# Patient Record
Sex: Male | Born: 1935 | Race: White | Hispanic: No | Marital: Single
Health system: Midwestern US, Community
[De-identification: ages and names within clinical notes are randomized; demographics above are authoritative.]

## PROBLEM LIST (undated history)

## (undated) DIAGNOSIS — R569 Unspecified convulsions: Secondary | ICD-10-CM

## (undated) DIAGNOSIS — H5440 Blindness, one eye, unspecified eye: Secondary | ICD-10-CM

## (undated) DIAGNOSIS — R339 Retention of urine, unspecified: Secondary | ICD-10-CM

## (undated) HISTORY — PX: EYE SURGERY: SHX253

---

## 2007-05-15 ENCOUNTER — Emergency Department: Payer: Self-pay | Admitting: Emergency Medicine

## 2007-05-27 ENCOUNTER — Emergency Department: Payer: Self-pay | Admitting: Urology

## 2007-06-05 ENCOUNTER — Emergency Department: Payer: Self-pay | Admitting: Urology

## 2007-06-26 ENCOUNTER — Other Ambulatory Visit: Payer: Self-pay

## 2007-06-26 ENCOUNTER — Ambulatory Visit: Payer: Self-pay | Admitting: Urology

## 2007-07-05 ENCOUNTER — Emergency Department: Payer: Self-pay | Admitting: Emergency Medicine

## 2007-07-09 ENCOUNTER — Emergency Department: Payer: Self-pay | Admitting: Emergency Medicine

## 2007-07-10 ENCOUNTER — Ambulatory Visit: Payer: Self-pay | Admitting: Urology

## 2007-07-23 ENCOUNTER — Other Ambulatory Visit: Payer: Self-pay

## 2007-07-23 ENCOUNTER — Emergency Department: Payer: Self-pay | Admitting: Emergency Medicine

## 2011-11-30 ENCOUNTER — Ambulatory Visit: Payer: Self-pay | Admitting: Ophthalmology

## 2011-11-30 DIAGNOSIS — I499 Cardiac arrhythmia, unspecified: Secondary | ICD-10-CM

## 2011-12-06 ENCOUNTER — Ambulatory Visit: Payer: Self-pay | Admitting: Ophthalmology

## 2012-05-01 ENCOUNTER — Ambulatory Visit: Payer: Self-pay | Admitting: Ophthalmology

## 2014-08-04 NOTE — Op Note (Signed)
PATIENT NAME:  Jerry Brown, Jerry Brown MR#:  [id number] DATE OF BIRTH:  03/03/1935  DATE OF PROCEDURE:  12/06/2011   PROCEDURES PERFORMED:  1. Pars plana vitrectomy of the right eye.  2. Gas exchange of the right eye.  3. Endolaser of the right eye.  4. Retinal detachment repair right eye.   PREOPERATIVE DIAGNOSIS: Rhegmatogenous retinal detachment macula off.   POSTOPERATIVE DIAGNOSIS: Rhegmatogenous retinal detachment macula off.   ESTIMATED BLOOD LOSS: Less than 1 mL.  PRIMARY SURGEON: Ignacia FellingMatthew F. Nikky Duba, MD  ANESTHESIA: Retrobulbar block of the right eye with monitored anesthesia care.   COMPLICATIONS: None.   INDICATION FOR PROCEDURE: This is a patient who presented to my office with a macula off retinal detachment. Patient noted that he had lost central vision over two weeks prior. Risks, benefits, and alternatives of the above procedure were discussed and the patient wished to proceed.   DETAILS OF PROCEDURE: After informed consent was obtained, the patient was brought into the operative suite at [facility]. Patient was placed in supine position and was given a small dose of Alfenta and a retrobulbar block was performed on the right eye by the primary surgeon without complications. The right eye was prepped and draped in sterile manner. After lid speculum was inserted, a 25-gauge trocar was placed inferotemporally through displaced conjunctiva in an oblique fashion. The infusion cannula was turned on and inserted through the trocar and secured into position with Steri-Strips. Two more trocars were placed in a similar fashion superotemporally and superonasally. The vitreous cutter and light pipe were introduced in the eye and a core vitrectomy was performed. A peripheral vitrectomy was performed for 360 degrees with special care to go to the ora serrata without encompassing any retina. A single retinal break was identified at approximately 8:30. Endo cautery was introduced  and this tear was marked. No further tears could be identified for 360 degrees on examination. Perfluoron was injected into the posterior pole to flatten the retina up to the level of the retinal tear. The soft tip extrusion cannula was utilized to do an air fluid exchange through the retinal tear and the retina completely flattened. Perfluoron was injected in order to bring up to almost the posterior chamber well. Endolaser was introduced and four rows of laser were placed around the retinal tear. Endolaser was carried for 360 degrees from the equator to the ora serrata in order to create a new ora. Once this was completed, the Perfluoron was removed and special care was taken to remove any remnant fluid above it in order to prevent any further retinal detachment. Upon complete removal of the Perfluoron the retina was noted to be completely flat for 360 degrees. Endolaser was reintroduced in order to ensure completion of prior described laser. 22% SF6 was used as an Nurse, children'sair gas exchange. All the trocars were removed. Two of the trocar sites were closed using plain 6-0 gut. The eye was pressurized with the SF6 to a pressure of 15 mmHg. 5 mg of dexamethasone was given into the inferior fornix. The lid speculum was removed and the eye was cleaned. TobraDex was placed in the eye and a patch and shield were placed over the eye. Patient was taken to postanesthesia care with instructions to remain left side down.   ____________________________ Ignacia FellingMatthew F. Champ MungoAppenzeller, MD mfa:cms D: 12/06/2011 10:24:39 ET T: 12/06/2011 10:46:13 ET JOB#: [id number]  cc: Ignacia FellingMatthew F. Champ MungoAppenzeller, MD, <Dictator>  Cline CoolsMATTHEW F Shervon Kerwin MD ELECTRONICALLY SIGNED 12/06/2011 12:36

## 2014-08-07 NOTE — Op Note (Signed)
PATIENT NAME:  Jerry Brown, Jerry Brown MR#:  [id number] DATE OF BIRTH:  09-Oct-1934  DATE OF PROCEDURE:  05/01/2012  PREOPERATIVE DIAGNOSIS: Rhegmatogenous retinal detachment of the right eye.   POSTOPERATIVE DIAGNOSES:  1. Complex retinal detachment.  2. Proliferative vitreoretinopathy.  PROCEDURES PERFORMED: 1. Pars plana vitrectomy of the right eye.  2. Endolaser of the right eye.  3. Oil injection of the right eye.  4. Membrane peel of the right eye.   PRIMARY SURGEON: Aron Baba, MD  ANESTHESIA: Endotracheal anesthesia with monitored anesthesia care and a supplemental retrobulbar block of the right eye.   COMPLICATIONS: None.   INDICATIONS FOR PROCEDURE: This is a patient who had undergone original rhegmatogenous retinal detachment repair back in September of 2013. The patient presented to my office in November of 2013 at 2 month postop with improvement in vision from light perception to 20/100 and a completely attached retina. The patient then presented to my office two days ago noting that 3 to 4 days after his last visit he lost vision in the eye and did not contact anyone regarding this change, as opposed to instructions. Examination revealed a rhegmatogenous retinal detachment of the right eye. Risks, benefits and alternatives of the above procedure were discussed and the patient wished to proceed.   DETAILS OF PROCEDURE: After informed consent was obtained, the patient was brought into the operative suite, at [facility]. The patient was placed in supine position and was induced by the anesthesia team without complication. A retrobulbar block was performed, on the right eye, by the primary surgeon without complication. The right eye was prepped and draped in sterile manner. After lid speculum was inserted, a 23-gauge trocar was placed inferotemporally through displaced conjunctiva, in an oblique fashion, 3 mm beyond the limbus. The infusion cannula was turned on  and inserted through the trocar and secured into position with Steri-Strips. Two more trocars were placed, in a similar fashion, superotemporally and superonasally. Vitreous cutter and light pipe were introduced in the eye and the retina was investigated. At the original tear site, at approximately 3 o'clock, proliferative vitreoretinopathy was noted with an associated retinal tear. No further tears could be identified for 360 degrees along the posterior edge of the prior laser. This area of proliferative vitreoretinopathy was incorporated with the new tear and was amputated using the vitreous cutter. PFO was injected onto the posterior and the retina completely flattened with extrusion of fluid out through the new tear. Once the retina was completely flattened, endolaser was introduced in the eye and panretinal photocoagulation was performed from the arcades out to the ora serrata for 360 degrees. Special care of four rows of confluent laser were placed at the area of proliferative vitreoretinopathy amputation. An air-fluid exchange was then performed down to the level of the retinal opening and time was allowed for dehydration of the vitreous base. This fluid was removed and the retina remained flat. The rest of the Perfluoron was then carefully removed in toto. Endolaser was reintroduced and areas of laser gaps were filled around the retinal opening. Once this was completed, 5000 centistoke silicone oil was injected into the posterior up to the level of the IOL. Just prior to this an inferior mechanical retinotomy was created. The silicone oil was brought up to the IOL and the trocars were removed. Pressure in the eye was confirmed to be approximately 10 mmHg. The wounds were noted to be watertight and 8 mg of dexamethasone was given into the inferior fornix.  The lid speculum was removed and the eye was cleaned. Cosopt and TobraDex was placed on the eye and a patch and shield were placed over the eye. The patient  was taken to postanesthesia care with instructions to remain right side down.  ____________________________ Jerry FellingMatthew F. Nikyah Lackman, MD mfa:sb D: 05/01/2012 11:11:00 ET T: 05/01/2012 11:53:53 ET JOB#: [id number]  cc: Jerry FellingMatthew F. Champ MungoAppenzeller, MD, <Dictator> Cline CoolsMATTHEW F Zyler Hyson MD ELECTRONICALLY SIGNED 05/08/2012 9:59

## 2015-01-07 ENCOUNTER — Encounter: Payer: Self-pay | Admitting: Emergency Medicine

## 2015-01-07 ENCOUNTER — Emergency Department
Admission: EM | Admit: 2015-01-07 | Discharge: 2015-01-07 | Disposition: A | Discharge status: Home / Self Care | Payer: Medicare Other | Attending: Emergency Medicine | Admitting: Emergency Medicine

## 2015-01-07 ENCOUNTER — Emergency Department: Payer: Medicare Other

## 2015-01-07 DIAGNOSIS — Y9389 Activity, other specified: Secondary | ICD-10-CM | POA: Diagnosis not present

## 2015-01-07 DIAGNOSIS — Y998 Other external cause status: Secondary | ICD-10-CM | POA: Diagnosis not present

## 2015-01-07 DIAGNOSIS — Y92410 Unspecified street and highway as the place of occurrence of the external cause: Secondary | ICD-10-CM | POA: Insufficient documentation

## 2015-01-07 DIAGNOSIS — Z87891 Personal history of nicotine dependence: Secondary | ICD-10-CM | POA: Diagnosis not present

## 2015-01-07 DIAGNOSIS — S4991XA Unspecified injury of right shoulder and upper arm, initial encounter: Secondary | ICD-10-CM | POA: Diagnosis present

## 2015-01-07 DIAGNOSIS — S43101A Unspecified dislocation of right acromioclavicular joint, initial encounter: Secondary | ICD-10-CM | POA: Diagnosis not present

## 2015-01-07 HISTORY — DX: Blindness, one eye, unspecified eye: H54.40

## 2015-01-07 MED ORDER — OXYCODONE-ACETAMINOPHEN 5-325 MG PO TABS
1.0000 | ORAL_TABLET | Freq: Once | ORAL | Status: AC
  Administered 2015-01-07: 1 via ORAL
  Filled 2015-01-07: qty 1

## 2015-01-07 MED ORDER — OXYCODONE-ACETAMINOPHEN 5-325 MG PO TABS: 1.0000 | ORAL_TABLET | Freq: Once | ORAL | Status: AC
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
## 2015-01-07 NOTE — Discharge Instructions (Signed)
Acromioclavicular Injuries °The AC (acromioclavicular) joint is the joint in the shoulder where the collarbone (clavicle) meets the shoulder blade (scapula). The part of the shoulder blade connected to the collarbone is called the acromion. Common problems with and treatments for the AC joint are detailed below. °ARTHRITIS °Arthritis occurs when the joint has been injured and the smooth padding between the joints (cartilage) is lost. This is the wear and tear seen in most joints of the body if they have been overused. This causes the joint to produce pain and swelling which is worse with activity.  °AC JOINT SEPARATION °AC joint separation means that the ligaments connecting the acromion of the shoulder blade and collarbone have been damaged, and the two bones no longer line up. AC separations can be anywhere from mild to severe, and are "graded" depending upon which ligaments are torn and how badly they are torn. °· Grade I Injury: the least damage is done, and the AC joint still lines up. °· Grade II Injury: damage to the ligaments which reinforce the AC joint. In a Grade II injury, these ligaments are stretched but not entirely torn. When stressed, the AC joint becomes painful and unstable. °· Grade III Injury: AC and secondary ligaments are completely torn, and the collarbone is no longer attached to the shoulder blade. This results in deformity; a prominence of the end of the clavicle. °AC JOINT FRACTURE °AC joint fracture means that there has been a break in the bones of the AC joint, usually the end of the clavicle. °TREATMENT °TREATMENT OF AC ARTHRITIS °· There is currently no way to replace the cartilage damaged by arthritis. The best way to improve the condition is to decrease the activities which aggravate the problem. Application of ice to the joint helps decrease pain and soreness (inflammation). The use of non-steroidal anti-inflammatory medication is helpful. °· If less conservative measures do not  work, then cortisone shots (injections) may be used. These are anti-inflammatories; they decrease the soreness in the joint and swelling. °· If non-surgical measures fail, surgery may be recommended. The procedure is generally removal of a portion of the end of the clavicle. This is the part of the collarbone closest to your acromion which is stabilized with ligaments to the acromion of the shoulder blade. This surgery may be performed using a tube-like instrument with a light (arthroscope) for looking into a joint. It may also be performed as an open surgery through a small incision by the surgeon. Most patients will have good range of motion within 6 weeks and may return to all activity including sports by 8-12 weeks, barring complications. °TREATMENT OF AN AC SEPARATION °· The initial treatment is to decrease pain. This is best accomplished by immobilizing the arm in a sling and placing an ice pack to the shoulder for 20 to 30 minutes every 2 hours as needed. As the pain starts to subside, it is important to begin moving the fingers, wrist, elbow and eventually the shoulder in order to prevent a stiff or "frozen" shoulder. Instruction on when and how much to move the shoulder will be provided by your caregiver. The length of time needed to regain full motion and function depends on the amount or grade of the injury. Recovery from a Grade I AC separation usually takes 10 to 14 days, whereas a Grade III may take 6 to 8 weeks. °· Grade I and II separations usually do not require surgery. Even Grade III injuries usually allow return to full   activity with few restrictions. Treatment is also based on the activity demands of the injured shoulder. For example, a high level quarterback with an injured throwing arm will receive more aggressive treatment than someone with a desk job who rarely uses his/her arm for strenuous activities. In some cases, a painful lump may persist which could require a later surgery. Surgery  can be very successful, but the benefits must be weighed against the potential risks. °TREATMENT OF AN AC JOINT FRACTURE °Fracture treatment depends on the type of fracture. Sometimes a splint or sling may be all that is required. Other times surgery may be required for repair. This is more frequently the case when the ligaments supporting the clavicle are completely torn. Your caregiver will help you with these decisions and together you can decide what will be the best treatment. °HOME CARE INSTRUCTIONS  °· Apply ice to the injury for 15-20 minutes each hour while awake for 2 days. Put the ice in a plastic bag and place a towel between the bag of ice and skin. °· If a sling has been applied, wear it constantly for as long as directed by your caregiver, even at night. The sling or splint can be removed for bathing or showering or as directed. Be sure to keep the shoulder in the same place as when the sling is on. Do not lift the arm. °· If a figure-of-eight splint has been applied it should be tightened gently by another person every day. Tighten it enough to keep the shoulders held back. Allow enough room to place the index finger between the body and strap. Loosen the splint immediately if there is numbness or tingling in the hands. °· Take over-the-counter or prescription medicines for pain, discomfort or fever as directed by your caregiver. °· If you or your child has received a follow up appointment, it is very important to keep that appointment in order to avoid long term complications, chronic pain or disability. °SEEK MEDICAL CARE IF:  °· The pain is not relieved with medications. °· There is increased swelling or discoloration that continues to get worse rather than better. °· You or your child has been unable to follow up as instructed. °· There is progressive numbness and tingling in the arm, forearm or hand. °SEEK IMMEDIATE MEDICAL CARE IF:  °· The arm is numb, cold or pale. °· There is increasing pain  in the hand, forearm or fingers. °MAKE SURE YOU:  °· Understand these instructions. °· Will watch your condition. °· Will get help right away if you are not doing well or get worse. °Document Released: 01/11/2005 Document Revised: 06/26/2011 Document Reviewed: 07/06/2008 °[employer]® Patient Information ©2015 [employer]. This information is not intended to replace advice given to you by your health care provider. Make sure you discuss any questions you have with your health care provider. ° °
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
## 2015-02-13 ENCOUNTER — Inpatient Hospital Stay (HOSPITAL_COMMUNITY)
Admission: EM | Admit: 2015-02-13 | Discharge: 2015-02-19 | DRG: 027 | Disposition: A | Discharge status: Home Health | Payer: Medicare Other | Source: Other Acute Inpatient Hospital | Attending: Neurosurgery | Admitting: Neurosurgery

## 2015-02-13 ENCOUNTER — Encounter
Admission: EM | Disposition: A | Discharge status: Other Acute Inpatient Hospital | Payer: Self-pay | Source: Home / Self Care | Attending: Emergency Medicine

## 2015-02-13 ENCOUNTER — Inpatient Hospital Stay (HOSPITAL_COMMUNITY): Payer: Medicare Other | Admitting: Anesthesiology

## 2015-02-13 ENCOUNTER — Emergency Department
Admission: EM | Admit: 2015-02-13 | Discharge: 2015-02-13 | Disposition: A | Discharge status: Other Acute Inpatient Hospital | Payer: Medicare Other | Attending: Emergency Medicine | Admitting: Emergency Medicine

## 2015-02-13 ENCOUNTER — Emergency Department: Payer: Medicare Other

## 2015-02-13 DIAGNOSIS — S065X9A Traumatic subdural hemorrhage with loss of consciousness of unspecified duration, initial encounter: Secondary | ICD-10-CM

## 2015-02-13 DIAGNOSIS — Z9181 History of falling: Secondary | ICD-10-CM | POA: Diagnosis not present

## 2015-02-13 DIAGNOSIS — R51 Headache: Secondary | ICD-10-CM | POA: Insufficient documentation

## 2015-02-13 DIAGNOSIS — S065XAA Traumatic subdural hemorrhage with loss of consciousness status unknown, initial encounter: Secondary | ICD-10-CM

## 2015-02-13 DIAGNOSIS — W1830XA Fall on same level, unspecified, initial encounter: Secondary | ICD-10-CM | POA: Diagnosis present

## 2015-02-13 DIAGNOSIS — S065X0A Traumatic subdural hemorrhage without loss of consciousness, initial encounter: Principal | ICD-10-CM | POA: Diagnosis present

## 2015-02-13 DIAGNOSIS — Y92096 Garden or yard of other non-institutional residence as the place of occurrence of the external cause: Secondary | ICD-10-CM | POA: Insufficient documentation

## 2015-02-13 DIAGNOSIS — W19XXXA Unspecified fall, initial encounter: Secondary | ICD-10-CM | POA: Insufficient documentation

## 2015-02-13 DIAGNOSIS — R079 Chest pain, unspecified: Secondary | ICD-10-CM | POA: Insufficient documentation

## 2015-02-13 DIAGNOSIS — G8929 Other chronic pain: Secondary | ICD-10-CM | POA: Diagnosis present

## 2015-02-13 DIAGNOSIS — N401 Enlarged prostate with lower urinary tract symptoms: Secondary | ICD-10-CM | POA: Diagnosis present

## 2015-02-13 DIAGNOSIS — Z87898 Personal history of other specified conditions: Secondary | ICD-10-CM | POA: Insufficient documentation

## 2015-02-13 DIAGNOSIS — H5440 Blindness, one eye, unspecified eye: Secondary | ICD-10-CM | POA: Diagnosis present

## 2015-02-13 DIAGNOSIS — Z87891 Personal history of nicotine dependence: Secondary | ICD-10-CM | POA: Diagnosis not present

## 2015-02-13 DIAGNOSIS — Z79899 Other long term (current) drug therapy: Secondary | ICD-10-CM | POA: Diagnosis not present

## 2015-02-13 DIAGNOSIS — M549 Dorsalgia, unspecified: Secondary | ICD-10-CM | POA: Diagnosis present

## 2015-02-13 DIAGNOSIS — H5441 Blindness, right eye, normal vision left eye: Secondary | ICD-10-CM | POA: Insufficient documentation

## 2015-02-13 DIAGNOSIS — R569 Unspecified convulsions: Secondary | ICD-10-CM | POA: Insufficient documentation

## 2015-02-13 DIAGNOSIS — R339 Retention of urine, unspecified: Secondary | ICD-10-CM | POA: Diagnosis present

## 2015-02-13 DIAGNOSIS — Y999 Unspecified external cause status: Secondary | ICD-10-CM | POA: Insufficient documentation

## 2015-02-13 DIAGNOSIS — I517 Cardiomegaly: Secondary | ICD-10-CM | POA: Insufficient documentation

## 2015-02-13 DIAGNOSIS — Y939 Activity, unspecified: Secondary | ICD-10-CM | POA: Insufficient documentation

## 2015-02-13 DIAGNOSIS — R310 Gross hematuria: Secondary | ICD-10-CM | POA: Diagnosis present

## 2015-02-13 HISTORY — DX: Unspecified convulsions: R56.9

## 2015-02-13 HISTORY — PX: CRANIOTOMY: SHX93

## 2015-02-13 LAB — ABO/RH: ABO/RH(D): A POS

## 2015-02-13 LAB — CBC WITH DIFFERENTIAL/PLATELET
BASOS ABS: 0 10*3/uL (ref 0–0.1)
BASOS PCT: 1 %
EOS ABS: 0.1 10*3/uL (ref 0–0.7)
EOS PCT: 3 %
HCT: 43.2 % (ref 40.0–52.0)
Hemoglobin: 14.8 g/dL (ref 13.0–18.0)
Lymphocytes Relative: 15 %
Lymphs Abs: 0.8 10*3/uL — ABNORMAL LOW (ref 1.0–3.6)
MCH: 32.4 pg (ref 26.0–34.0)
MCHC: 34.3 g/dL (ref 32.0–36.0)
MCV: 94.5 fL (ref 80.0–100.0)
MONO ABS: 0.3 10*3/uL (ref 0.2–1.0)
Monocytes Relative: 6 %
Neutro Abs: 4 10*3/uL (ref 1.4–6.5)
Neutrophils Relative %: 75 %
PLATELETS: 136 10*3/uL — AB (ref 150–440)
RBC: 4.57 MIL/uL (ref 4.40–5.90)
RDW: 13.5 % (ref 11.5–14.5)
WBC: 5.3 10*3/uL (ref 3.8–10.6)

## 2015-02-13 LAB — MRSA PCR SCREENING: MRSA BY PCR: NEGATIVE

## 2015-02-13 LAB — COMPREHENSIVE METABOLIC PANEL
ALT: 22 U/L (ref 17–63)
AST: 24 U/L (ref 15–41)
Albumin: 4 g/dL (ref 3.5–5.0)
Alkaline Phosphatase: 88 U/L (ref 38–126)
Anion gap: 4 — ABNORMAL LOW (ref 5–15)
BUN: 16 mg/dL (ref 6–20)
CHLORIDE: 110 mmol/L (ref 101–111)
CO2: 27 mmol/L (ref 22–32)
CREATININE: 1.05 mg/dL (ref 0.61–1.24)
Calcium: 8.9 mg/dL (ref 8.9–10.3)
GFR calc Af Amer: >60 mL/min (ref >60)
Glucose, Bld: 107 mg/dL — ABNORMAL HIGH (ref 65–99)
POTASSIUM: 4.1 mmol/L (ref 3.5–5.1)
SODIUM: 141 mmol/L (ref 135–145)
Total Bilirubin: 1.5 mg/dL — ABNORMAL HIGH (ref 0.3–1.2)
Total Protein: 6.8 g/dL (ref 6.5–8.1)

## 2015-02-13 LAB — CK: CK TOTAL: 250 U/L (ref 49–397)

## 2015-02-13 LAB — PREPARE RBC (CROSSMATCH)

## 2015-02-13 LAB — PROTIME-INR
INR: 1.04
Prothrombin Time: 13.8 seconds (ref 11.4–15.0)

## 2015-02-13 LAB — TROPONIN I

## 2015-02-13 LAB — APTT: APTT: 31 s (ref 24–36)

## 2015-02-13 SURGERY — CRANIOTOMY HEMATOMA EVACUATION SUBDURAL
Anesthesia: General | Site: Head | Laterality: Bilateral

## 2015-02-13 MED ORDER — SUGAMMADEX SODIUM 200 MG/2ML IV SOLN
INTRAVENOUS | Status: DC | PRN
  Administered 2015-02-13: 200 mg via INTRAVENOUS

## 2015-02-13 MED ORDER — LABETALOL HCL 5 MG/ML IV SOLN
10.0000 mg | INTRAVENOUS | Status: DC | PRN
Start: 2015-02-13 — End: 2015-02-19

## 2015-02-13 MED ORDER — FOLIC ACID 1 MG PO TABS
1.0000 mg | ORAL_TABLET | Freq: Once | ORAL | Status: AC
  Administered 2015-02-13: 1 mg via ORAL
  Filled 2015-02-13: qty 1

## 2015-02-13 MED ORDER — FENTANYL CITRATE (PF) 100 MCG/2ML IJ SOLN
INTRAMUSCULAR | Status: DC | PRN
  Administered 2015-02-13 (×3): 50 ug via INTRAVENOUS
  Administered 2015-02-13: 100 ug via INTRAVENOUS

## 2015-02-13 MED ORDER — ACETAMINOPHEN 325 MG PO TABS
650.0000 mg | ORAL_TABLET | ORAL | Status: DC | PRN
  Administered 2015-02-15 – 2015-02-19 (×3): 650 mg via ORAL
  Filled 2015-02-13 (×3): qty 2

## 2015-02-13 MED ORDER — SODIUM CHLORIDE 0.9 % IV SOLN: Freq: Once | INTRAVENOUS | Status: DC

## 2015-02-13 MED ORDER — PROPOFOL 10 MG/ML IV BOLUS
INTRAVENOUS | Status: DC | PRN
  Administered 2015-02-13: 120 mg via INTRAVENOUS

## 2015-02-13 MED ORDER — VITAMIN B-1 100 MG PO TABS
100.0000 mg | ORAL_TABLET | Freq: Once | ORAL | Status: AC
  Administered 2015-02-13: 100 mg via ORAL
  Filled 2015-02-13: qty 1

## 2015-02-13 MED ORDER — LIDOCAINE HCL (CARDIAC) 20 MG/ML IV SOLN
INTRAVENOUS | Status: DC | PRN
  Administered 2015-02-13: 80 mg via INTRAVENOUS

## 2015-02-13 MED ORDER — POTASSIUM CHLORIDE IN NACL 20-0.9 MEQ/L-% IV SOLN
INTRAVENOUS | Status: DC
  Administered 2015-02-13: 1000 mL via INTRAVENOUS
  Administered 2015-02-14: 75 mL/h via INTRAVENOUS
  Administered 2015-02-15 – 2015-02-18 (×6): via INTRAVENOUS
  Filled 2015-02-13 (×13): qty 1000

## 2015-02-13 MED ORDER — MORPHINE SULFATE (PF) 2 MG/ML IV SOLN
1.0000 mg | INTRAVENOUS | Status: DC | PRN
  Administered 2015-02-13: 2 mg via INTRAVENOUS
  Administered 2015-02-16 – 2015-02-17 (×2): 1 mg via INTRAVENOUS
  Administered 2015-02-17: 2 mg via INTRAVENOUS
  Filled 2015-02-13 (×4): qty 1

## 2015-02-13 MED ORDER — ACETAMINOPHEN 650 MG RE SUPP: 650.0000 mg | RECTAL | Status: DC | PRN

## 2015-02-13 MED ORDER — SODIUM CHLORIDE 0.9 % IV SOLN
INTRAVENOUS | Status: DC | PRN
  Administered 2015-02-13: 17:00:00 via INTRAVENOUS

## 2015-02-13 MED ORDER — THROMBIN 20000 UNITS EX SOLR
CUTANEOUS | Status: DC | PRN
  Administered 2015-02-13: 20 mL via TOPICAL

## 2015-02-13 MED ORDER — HYDROCODONE-ACETAMINOPHEN 5-325 MG PO TABS
1.0000 | ORAL_TABLET | ORAL | Status: DC | PRN
  Administered 2015-02-13 – 2015-02-17 (×7): 1 via ORAL
  Filled 2015-02-13 (×7): qty 1

## 2015-02-13 MED ORDER — BUPIVACAINE HCL (PF) 0.25 % IJ SOLN
INTRAMUSCULAR | Status: DC | PRN
  Administered 2015-02-13: 8.5 mL

## 2015-02-13 MED ORDER — 0.9 % SODIUM CHLORIDE (POUR BTL) OPTIME
TOPICAL | Status: DC | PRN
  Administered 2015-02-13 (×5): 1000 mL

## 2015-02-13 MED ORDER — SODIUM CHLORIDE 0.9 % IV SOLN
500.0000 mg | Freq: Two times a day (BID) | INTRAVENOUS | Status: DC
  Administered 2015-02-13 – 2015-02-17 (×8): 500 mg via INTRAVENOUS
  Filled 2015-02-13 (×9): qty 5

## 2015-02-13 MED ORDER — PANTOPRAZOLE SODIUM 40 MG IV SOLR: 40.0000 mg | Freq: Every day | INTRAVENOUS | Status: DC

## 2015-02-13 MED ORDER — SODIUM CHLORIDE 0.9 % IV SOLN
INTRAVENOUS | Status: DC | PRN
  Administered 2015-02-13 (×2): via INTRAVENOUS

## 2015-02-13 MED ORDER — ADULT MULTIVITAMIN W/MINERALS CH
1.0000 | ORAL_TABLET | Freq: Once | ORAL | Status: AC
  Administered 2015-02-13: 1 via ORAL
  Filled 2015-02-13: qty 1

## 2015-02-13 MED ORDER — EPHEDRINE SULFATE 50 MG/ML IJ SOLN
INTRAMUSCULAR | Status: DC | PRN
  Administered 2015-02-13 (×3): 10 mg via INTRAVENOUS

## 2015-02-13 MED ORDER — LIDOCAINE-EPINEPHRINE 1 %-1:100000 IJ SOLN
INTRAMUSCULAR | Status: DC | PRN
  Administered 2015-02-13: 8.5 mL via INTRADERMAL

## 2015-02-13 MED ORDER — LABETALOL HCL 5 MG/ML IV SOLN
INTRAVENOUS | Status: DC | PRN
  Administered 2015-02-13: 10 mg via INTRAVENOUS

## 2015-02-13 MED ORDER — DEXTROSE 5 % IV SOLN
10.0000 mg | INTRAVENOUS | Status: DC | PRN
  Administered 2015-02-13: 50 ug/min via INTRAVENOUS

## 2015-02-13 MED ORDER — ONDANSETRON HCL 4 MG PO TABS: 4.0000 mg | ORAL_TABLET | ORAL | Status: DC | PRN

## 2015-02-13 MED ORDER — LABETALOL HCL 5 MG/ML IV SOLN
INTRAVENOUS | Status: AC
  Filled 2015-02-13: qty 8

## 2015-02-13 MED ORDER — PHENYLEPHRINE HCL 10 MG/ML IJ SOLN
INTRAMUSCULAR | Status: DC | PRN
  Administered 2015-02-13 (×5): 80 ug via INTRAVENOUS

## 2015-02-13 MED ORDER — FENTANYL CITRATE (PF) 250 MCG/5ML IJ SOLN
INTRAMUSCULAR | Status: AC
  Filled 2015-02-13: qty 5

## 2015-02-13 MED ORDER — ONDANSETRON HCL 4 MG/2ML IJ SOLN: 4.0000 mg | INTRAMUSCULAR | Status: DC | PRN

## 2015-02-13 MED ORDER — ROCURONIUM BROMIDE 100 MG/10ML IV SOLN
INTRAVENOUS | Status: DC | PRN
  Administered 2015-02-13: 50 mg via INTRAVENOUS

## 2015-02-13 MED ORDER — SUGAMMADEX SODIUM 200 MG/2ML IV SOLN
INTRAVENOUS | Status: AC
  Filled 2015-02-13: qty 2

## 2015-02-13 MED ORDER — CEFAZOLIN SODIUM-DEXTROSE 2-3 GM-% IV SOLR
INTRAVENOUS | Status: DC | PRN
  Administered 2015-02-13: 2 g via INTRAVENOUS

## 2015-02-13 MED ORDER — GLYCOPYRROLATE 0.2 MG/ML IJ SOLN
INTRAMUSCULAR | Status: DC | PRN
  Administered 2015-02-13 (×2): 0.2 mg via INTRAVENOUS

## 2015-02-13 MED ORDER — DOCUSATE SODIUM 100 MG PO CAPS
100.0000 mg | ORAL_CAPSULE | Freq: Two times a day (BID) | ORAL | Status: DC
  Administered 2015-02-13 – 2015-02-19 (×12): 100 mg via ORAL
  Filled 2015-02-13 (×12): qty 1

## 2015-02-13 SURGICAL SUPPLY — 78 items
BANDAGE GAUZE 4  KLING STR (GAUZE/BANDAGES/DRESSINGS) IMPLANT
BENZOIN TINCTURE PRP APPL 2/3 (GAUZE/BANDAGES/DRESSINGS) IMPLANT
BIT DRILL WIRE PASS 1.3MM (BIT) IMPLANT
BNDG GAUZE ELAST 4 BULKY (GAUZE/BANDAGES/DRESSINGS) IMPLANT
BRUSH SCRUB EZ 1% IODOPHOR (MISCELLANEOUS) ×3 IMPLANT
BRUSH SCRUB EZ PLAIN DRY (MISCELLANEOUS) ×3 IMPLANT
BUR ACORN 6.0 PRECISION (BURR) ×2 IMPLANT
BUR ACORN 6.0MM PRECISION (BURR) ×1
BUR SPIRAL ROUTER 2.3 (BUR) IMPLANT
BUR SPIRAL ROUTER 2.3MM (BUR)
CANISTER SUCT 3000ML PPV (MISCELLANEOUS) ×3 IMPLANT
CATH ROBINSON RED A/P 12FR (CATHETERS) IMPLANT
CLIP TI MEDIUM 6 (CLIP) IMPLANT
DECANTER SPIKE VIAL GLASS SM (MISCELLANEOUS) ×3 IMPLANT
DRAIN PENROSE 1/2X12 LTX STRL (WOUND CARE) IMPLANT
DRAIN SNY WOU 7FLT (WOUND CARE) IMPLANT
DRAPE NEUROLOGICAL W/INCISE (DRAPES) ×3 IMPLANT
DRAPE SURG IRRIG POUCH 19X23 (DRAPES) IMPLANT
DRAPE WARM FLUID 44X44 (DRAPE) ×3 IMPLANT
DRILL WIRE PASS 1.3MM (BIT)
DRSG OPSITE POSTOP 4X6 (GAUZE/BANDAGES/DRESSINGS) ×6 IMPLANT
DRSG PAD ABDOMINAL 8X10 ST (GAUZE/BANDAGES/DRESSINGS) IMPLANT
DURAPREP 6ML APPLICATOR 50/CS (WOUND CARE) ×3 IMPLANT
ELECT CAUTERY BLADE 6.4 (BLADE) ×3 IMPLANT
ELECT REM PT RETURN 9FT ADLT (ELECTROSURGICAL) ×3
ELECTRODE REM PT RTRN 9FT ADLT (ELECTROSURGICAL) ×1 IMPLANT
EVACUATOR SILICONE 100CC (DRAIN) IMPLANT
GAUZE SPONGE 4X4 12PLY STRL (GAUZE/BANDAGES/DRESSINGS) IMPLANT
GAUZE SPONGE 4X4 16PLY XRAY LF (GAUZE/BANDAGES/DRESSINGS) IMPLANT
GLOVE BIO SURGEON STRL SZ 6.5 (GLOVE) ×2 IMPLANT
GLOVE BIO SURGEON STRL SZ7 (GLOVE) ×9 IMPLANT
GLOVE BIO SURGEON STRL SZ8 (GLOVE) ×3 IMPLANT
GLOVE BIO SURGEONS STRL SZ 6.5 (GLOVE) ×1
GLOVE BIOGEL PI IND STRL 8 (GLOVE) ×1 IMPLANT
GLOVE BIOGEL PI IND STRL 8.5 (GLOVE) ×1 IMPLANT
GLOVE BIOGEL PI INDICATOR 8 (GLOVE) ×2
GLOVE BIOGEL PI INDICATOR 8.5 (GLOVE) ×2
GLOVE ECLIPSE 7.5 STRL STRAW (GLOVE) ×3 IMPLANT
GLOVE EXAM NITRILE LRG STRL (GLOVE) IMPLANT
GLOVE EXAM NITRILE MD LF STRL (GLOVE) IMPLANT
GLOVE EXAM NITRILE XL STR (GLOVE) IMPLANT
GLOVE EXAM NITRILE XS STR PU (GLOVE) IMPLANT
GOWN STRL REUS W/ TWL LRG LVL3 (GOWN DISPOSABLE) IMPLANT
GOWN STRL REUS W/ TWL XL LVL3 (GOWN DISPOSABLE) IMPLANT
GOWN STRL REUS W/TWL 2XL LVL3 (GOWN DISPOSABLE) IMPLANT
GOWN STRL REUS W/TWL LRG LVL3 (GOWN DISPOSABLE)
GOWN STRL REUS W/TWL XL LVL3 (GOWN DISPOSABLE)
HEMOSTAT SURGICEL 2X14 (HEMOSTASIS) ×3 IMPLANT
KIT BASIN OR (CUSTOM PROCEDURE TRAY) ×3 IMPLANT
KIT ROOM TURNOVER OR (KITS) ×3 IMPLANT
NEEDLE HYPO 25X1 1.5 SAFETY (NEEDLE) ×3 IMPLANT
NS IRRIG 1000ML POUR BTL (IV SOLUTION) ×3 IMPLANT
PACK CRANIOTOMY (CUSTOM PROCEDURE TRAY) ×3 IMPLANT
PAD ARMBOARD 7.5X6 YLW CONV (MISCELLANEOUS) ×3 IMPLANT
PATTIES SURGICAL .5 X.5 (GAUZE/BANDAGES/DRESSINGS) IMPLANT
PATTIES SURGICAL .5 X3 (DISPOSABLE) IMPLANT
PATTIES SURGICAL 1X1 (DISPOSABLE) IMPLANT
PIN MAYFIELD SKULL DISP (PIN) IMPLANT
PLATE 1.5  2HOLE LNG NEURO (Plate) ×6 IMPLANT
PLATE 1.5 2HOLE LNG NEURO (Plate) ×3 IMPLANT
PLATE 1.5/0.5 8HOLE SUB TEMP (Plate) ×3 IMPLANT
SCREW SELF DRILL HT 1.5/4MM (Screw) ×30 IMPLANT
SPECIMEN JAR SMALL (MISCELLANEOUS) IMPLANT
SPONGE NEURO XRAY DETECT 1X3 (DISPOSABLE) IMPLANT
SPONGE SURGIFOAM ABS GEL 12-7 (HEMOSTASIS) IMPLANT
STAPLER SKIN PROX WIDE 3.9 (STAPLE) ×3 IMPLANT
SUT ETHILON 3 0 FSL (SUTURE) ×3 IMPLANT
SUT ETHILON 3 0 PS 1 (SUTURE) IMPLANT
SUT NURALON 4 0 TR CR/8 (SUTURE) ×6 IMPLANT
SUT VIC AB 2-0 CP2 18 (SUTURE) ×6 IMPLANT
SUT VIC AB 3-0 SH 8-18 (SUTURE) IMPLANT
SYR CONTROL 10ML LL (SYRINGE) ×3 IMPLANT
TOWEL OR 17X24 6PK STRL BLUE (TOWEL DISPOSABLE) ×3 IMPLANT
TOWEL OR 17X26 10 PK STRL BLUE (TOWEL DISPOSABLE) ×3 IMPLANT
TRAP SPECIMEN MUCOUS 40CC (MISCELLANEOUS) IMPLANT
TRAY FOLEY W/METER SILVER 14FR (SET/KITS/TRAYS/PACK) IMPLANT
UNDERPAD 30X30 INCONTINENT (UNDERPADS AND DIAPERS) IMPLANT
WATER STERILE IRR 1000ML POUR (IV SOLUTION) ×3 IMPLANT

## 2015-02-13 NOTE — Brief Op Note (Signed)
02/13/2015  6:57 PM  PATIENT:  Jerry Brown  80 y.o. male  PRE-OPERATIVE DIAGNOSIS:  bilateral subdural hematomas  POST-OPERATIVE DIAGNOSIS:  bilateral subdural hematomas  PROCEDURE:  Procedure(s): BILATERAL CRANIOTOMY HEMATOMA EVACUATION SUBDURAL (Bilateral)  SURGEON:  Surgeon(s) and Role:    * Maeola HarmanJoseph Onis Markoff, MD - Primary  PHYSICIAN ASSISTANT:   ASSISTANTS: none   ANESTHESIA:   general  EBL:  Total I/O In: 1500 [I.V.:1500] Out: 100 [Blood:100]  BLOOD ADMINISTERED:none  DRAINS: (10) Jackson-Pratt drain(s) with closed bulb suction in the subdural space   LOCAL MEDICATIONS USED:  LIDOCAINE   SPECIMEN:  No Specimen  DISPOSITION OF SPECIMEN:  N/A  COUNTS:  YES  TOURNIQUET:  * No tourniquets in log *  DICTATION: Patient is 80 year old man who has developed bilateral subdural hematomas. He has had progressively worsening headache and CT shows bilateral subdural hematoma with mass effect. It was elected to take patient to surgery for bilateral craniotomies for SDH.  Procedure: Following smooth intubation, patient was placed in brow up position. Head was placed on donut head holder and bi-frontal scalp was shaved and prepped and draped in usual sterile fashion. Area of planned incision was infiltrated with lidocaine. A ilinear incision was made and carried through temporalis fascia and muscle to expose calvarium on each side of his head. Skull flaps was elevated exposing subdural hematoma. Dura was opened and subdural was evacuated. The subdural was larger and under greater pressure on the right. Both subdural cavities were irrigated with saline until significantly clearer. Subdural membranes were opened on both sides. Hemostasis was assured. The brain was considerably more relaxed after hematoma evacuation. Bilateral #10 JP drains were placed and anchored with Nylon sutures. Bone flaps was replaced with plates on the left and a titanium plate was used on the right because of  inadvertent contamination of the bone flap from the right side, the fascia and galea were closed with 2-0 vicryl sutures and the skin was re approximated with staples. Sterile occlusive dressings were placed. Patient was returned to a supine position, extubated,  and transferred to the ICU in stable and satisfactory condition. Counts were correct at the end of the case.   PLAN OF CARE: Admit to inpatient   PATIENT DISPOSITION:  PACU - hemodynamically stable.   Delay start of Pharmacological VTE agent (>24hrs) due to surgical blood loss or risk of bleeding: yes
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
## 2015-02-13 NOTE — Op Note (Signed)
02/13/2015  6:57 PM  PATIENT:  Zamarion Charles Eldredge  80 y.o. male  PRE-OPERATIVE DIAGNOSIS:  bilateral subdural hematomas  POST-OPERATIVE DIAGNOSIS:  bilateral subdural hematomas  PROCEDURE:  Procedure(s): BILATERAL CRANIOTOMY HEMATOMA EVACUATION SUBDURAL (Bilateral)  SURGEON:  Surgeon(s) and Role:    * Glendon Fiser, MD - Primary  PHYSICIAN ASSISTANT:   ASSISTANTS: none   ANESTHESIA:   general  EBL:  Total I/O In: 1500 [I.V.:1500] Out: 100 [Blood:100]  BLOOD ADMINISTERED:none  DRAINS: (10) Jackson-Pratt drain(s) with closed bulb suction in the subdural space   LOCAL MEDICATIONS USED:  LIDOCAINE   SPECIMEN:  No Specimen  DISPOSITION OF SPECIMEN:  N/A  COUNTS:  YES  TOURNIQUET:  * No tourniquets in log *  DICTATION: Patient is 80 year old man who has developed bilateral subdural hematomas. He has had progressively worsening headache and CT shows bilateral subdural hematoma with mass effect. It was elected to take patient to surgery for bilateral craniotomies for SDH.  Procedure: Following smooth intubation, patient was placed in brow up position. Head was placed on donut head holder and bi-frontal scalp was shaved and prepped and draped in usual sterile fashion. Area of planned incision was infiltrated with lidocaine. A ilinear incision was made and carried through temporalis fascia and muscle to expose calvarium on each side of his head. Skull flaps was elevated exposing subdural hematoma. Dura was opened and subdural was evacuated. The subdural was larger and under greater pressure on the right. Both subdural cavities were irrigated with saline until significantly clearer. Subdural membranes were opened on both sides. Hemostasis was assured. The brain was considerably more relaxed after hematoma evacuation. Bilateral #10 JP drains were placed and anchored with Nylon sutures. Bone flaps was replaced with plates on the left and a titanium plate was used on the right because of  inadvertent contamination of the bone flap from the right side, the fascia and galea were closed with 2-0 vicryl sutures and the skin was re approximated with staples. Sterile occlusive dressings were placed. Patient was returned to a supine position, extubated,  and transferred to the ICU in stable and satisfactory condition. Counts were correct at the end of the case.   PLAN OF CARE: Admit to inpatient   PATIENT DISPOSITION:  PACU - hemodynamically stable.   Delay start of Pharmacological VTE agent (>24hrs) due to surgical blood loss or risk of bleeding: yes  

## 2015-02-13 NOTE — ED Notes (Signed)
Pt's youngest daughter Kennith Centerracey phone # [phone]

## 2015-02-13 NOTE — Transfer of Care (Signed)
Immediate Anesthesia Transfer of Care Note  Patient: Jerry Brown  Procedure(s) Performed: Procedure(s): BILATERAL CRANIOTOMY HEMATOMA EVACUATION SUBDURAL (Bilateral)  Patient Location: PACU  Anesthesia Type:General  Level of Consciousness: awake, alert  and oriented  Airway & Oxygen Therapy: Patient Spontanous Breathing and Patient connected to face mask oxygen  Post-op Assessment: Report given to RN and Post -op Vital signs reviewed and stable  Post vital signs: Reviewed and stable  Last Vitals:  Filed Vitals:   02/13/15 1530  BP: 101/74  Pulse: 92  Temp:   Resp: 22    Complications: No apparent anesthesia complications

## 2015-02-13 NOTE — Anesthesia Procedure Notes (Signed)
Procedure Name: Intubation Date/Time: 02/13/2015 5:30 PM Performed by: Dairl PonderJIANG, Maresa Morash Pre-anesthesia Checklist: Patient identified, Timeout performed, Emergency Drugs available, Suction available and Patient being monitored Patient Re-evaluated:Patient Re-evaluated prior to inductionOxygen Delivery Method: Circle system utilized Preoxygenation: Pre-oxygenation with 100% oxygen Intubation Type: IV induction Ventilation: Mask ventilation without difficulty and Oral airway inserted - appropriate to patient size Laryngoscope Size: Mac and 4 Grade View: Grade I Tube type: Subglottic suction tube Tube size: 7.5 mm Number of attempts: 1 Airway Equipment and Method: Stylet Placement Confirmation: ETT inserted through vocal cords under direct vision,  breath sounds checked- equal and bilateral and positive ETCO2 Secured at: 21 cm Tube secured with: Tape Dental Injury: Teeth and Oropharynx as per pre-operative assessment
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
## 2015-02-13 NOTE — Anesthesia Postprocedure Evaluation (Signed)
Anesthesia Post Note  Patient: Jerry Brown  Procedure(s) Performed: Procedure(s) (LRB): BILATERAL CRANIOTOMY HEMATOMA EVACUATION SUBDURAL (Bilateral)  Anesthesia type: general  Patient location: PACU  Post pain: Pain level controlled  Post assessment: Patient's Cardiovascular Status Stable  Last Vitals:  Filed Vitals:   02/13/15 1530  BP: 101/74  Pulse: 92  Temp:   Resp: 22    Post vital signs: Reviewed and stable  Level of consciousness: sedated  Complications: No apparent anesthesia complications

## 2015-02-13 NOTE — Anesthesia Preprocedure Evaluation (Addendum)
Anesthesia Evaluation  Patient identified by MRN, date of birth, ID band Patient awake    Reviewed: Allergy & Precautions, NPO status , Patient's Chart, lab work & pertinent test results  Airway Mallampati: II  TM Distance: >3 FB     Dental  (+) Edentulous Lower, Edentulous Upper   Pulmonary former smoker,    Pulmonary exam normal        Cardiovascular Normal cardiovascular exam Rhythm:Regular     Neuro/Psych    GI/Hepatic   Endo/Other    Renal/GU      Musculoskeletal   Abdominal   Peds  Hematology   Anesthesia Other Findings   Reproductive/Obstetrics                          Anesthesia Physical Anesthesia Plan  ASA: I and emergent  Anesthesia Plan: General   Post-op Pain Management:    Induction: Intravenous  Airway Management Planned: Oral ETT  Additional Equipment: Arterial line  Intra-op Plan:   Post-operative Plan: Extubation in OR  Informed Consent: I have reviewed the patients History and Physical, chart, labs and discussed the procedure including the risks, benefits and alternatives for the proposed anesthesia with the patient or authorized representative who has indicated his/her understanding and acceptance.   Dental advisory given  Plan Discussed with: Surgeon and CRNA  Anesthesia Plan Comments:       Anesthesia Quick Evaluation
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
## 2015-02-13 NOTE — ED Notes (Signed)
Report called to [facility] transport.

## 2015-02-14 LAB — CBC
HCT: 36.8 % — ABNORMAL LOW (ref 39.0–52.0)
HEMOGLOBIN: 12.5 g/dL — AB (ref 13.0–17.0)
MCH: 32.6 pg (ref 26.0–34.0)
MCHC: 34 g/dL (ref 30.0–36.0)
MCV: 95.8 fL (ref 78.0–100.0)
Platelets: 106 10*3/uL — ABNORMAL LOW (ref 150–400)
RBC: 3.84 MIL/uL — ABNORMAL LOW (ref 4.22–5.81)
RDW: 12.9 % (ref 11.5–15.5)
WBC: 5.1 10*3/uL (ref 4.0–10.5)

## 2015-02-14 LAB — BASIC METABOLIC PANEL
Anion gap: 8 (ref 5–15)
BUN: 10 mg/dL (ref 6–20)
CHLORIDE: 108 mmol/L (ref 101–111)
CO2: 22 mmol/L (ref 22–32)
CREATININE: 0.98 mg/dL (ref 0.61–1.24)
Calcium: 7.9 mg/dL — ABNORMAL LOW (ref 8.9–10.3)
GFR calc non Af Amer: >60 mL/min (ref >60)
Glucose, Bld: 104 mg/dL — ABNORMAL HIGH (ref 65–99)
Potassium: 4.2 mmol/L (ref 3.5–5.1)
Sodium: 138 mmol/L (ref 135–145)

## 2015-02-14 MED ORDER — CETYLPYRIDINIUM CHLORIDE 0.05 % MT LIQD
7.0000 mL | Freq: Two times a day (BID) | OROMUCOSAL | Status: DC
  Administered 2015-02-14 – 2015-02-18 (×8): 7 mL via OROMUCOSAL

## 2015-02-14 MED ORDER — WHITE PETROLATUM GEL
Status: AC
  Administered 2015-02-14: 1
  Filled 2015-02-14: qty 1

## 2015-02-14 MED ORDER — PANTOPRAZOLE SODIUM 40 MG PO TBEC
40.0000 mg | DELAYED_RELEASE_TABLET | Freq: Every day | ORAL | Status: DC
  Administered 2015-02-14 – 2015-02-19 (×6): 40 mg via ORAL
  Filled 2015-02-14 (×6): qty 1
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
## 2015-02-14 NOTE — Consult Note (Signed)
Urology Consult  Referring physician: Dr. Vertell Limber Reason for referral: urinary retention  Chief Complaint: Suprapubic pain  History of Present Illness: Jerry Brown is a 80yo with a hx of BPH s/p Greenlight photovaporization of the prostate who was admitted with subdural hematomas. He was taken to the OR and there was an inability to place a foley catheter. He states he has not had difficulty urinating since his surgery several years ago. He denies any significant LUTS prior to hospitalization. He has been having intermittent painless gross hematuria for 3 days. He denies any fevers/chill/sweats.  Past Medical History  Diagnosis Date  . Blind one eye   . Seizures [facility])    Past Surgical History  Procedure Laterality Date  . Eye surgery      Medications: I have reviewed the patient's current medications. Allergies: No Known Allergies  No family history on file. Social History:  reports that he has quit smoking. He has never used smokeless tobacco. He reports that he drinks alcohol. He reports that he does not use illicit drugs.  Review of Systems  Gastrointestinal: Positive for nausea.  Genitourinary: Positive for hematuria.  Neurological: Positive for dizziness and headaches.  All other systems reviewed and are negative.   Physical Exam:  Vital signs in last 24 hours: Temp:  [97.5 F (36.4 C)-98.3 F (36.8 C)] 98.3 F (36.8 C) (10/30 0734) Pulse Rate:  [35-97] 35 (10/30 0700) Resp:  [12-27] 15 (10/30 0700) BP: (100-164)/(41-115) 106/62 mmHg (10/30 0700) SpO2:  [89 %-100 %] 97 % (10/30 0700) Arterial Line BP: (131-175)/(60-81) 136/68 mmHg (10/29 2100) Weight:  [81.5 kg (179 lb 10.8 oz)] 81.5 kg (179 lb 10.8 oz) (10/29 1104) Physical Exam  Constitutional: He is oriented to person, place, and time. He appears well-developed and well-nourished.  HENT:  Head: Normocephalic and atraumatic.  Eyes: EOM are normal. Pupils are equal, round, and reactive to light.  Neck: Normal range of  motion. No thyromegaly present.  Cardiovascular: Normal rate and regular rhythm.   Respiratory: Effort normal. No respiratory distress.  GI: Soft. He exhibits no distension. Hernia confirmed negative in the right inguinal area and confirmed negative in the left inguinal area.  Genitourinary: Testes normal and penis normal. Cremasteric reflex is present. Right testis shows no mass. Left testis shows no mass. Circumcised. No hypospadias or penile tenderness.  Musculoskeletal: Normal range of motion.  Neurological: He is alert and oriented to person, place, and time.  Skin: Skin is warm and dry.  Psychiatric: He has a normal mood and affect. His behavior is normal. Judgment and thought content normal.    Laboratory Data:  Results for orders placed or performed during the hospital encounter of 02/13/15 (from the past 72 hour(s))  Type and screen     Status: None (Preliminary result)   Collection Time: 02/13/15  5:06 PM  Result Value Ref Range   ABO/RH(D) A POS    Antibody Screen NEG    Sample Expiration 02/16/2015    Unit Number [id number]    Blood Component Type RED CELLS,LR    Unit division 00    Status of Unit ALLOCATED    Transfusion Status OK TO TRANSFUSE    Crossmatch Result Compatible    Unit Number [id number]    Blood Component Type RED CELLS,LR    Unit division 00    Status of Unit ALLOCATED    Transfusion Status OK TO TRANSFUSE    Crossmatch Result Compatible   Prepare RBC (crossmatch)     Status:  None   Collection Time: 02/13/15  5:06 PM  Result Value Ref Range   Order Confirmation ORDER PROCESSED BY BLOOD BANK   ABO/Rh     Status: None   Collection Time: 02/13/15  5:06 PM  Result Value Ref Range   ABO/RH(D) A POS   MRSA PCR Screening     Status: None   Collection Time: 02/13/15  7:40 PM  Result Value Ref Range   MRSA by PCR NEGATIVE NEGATIVE    Comment:        The GeneXpert MRSA Assay (FDA approved for NASAL specimens only), is one component of  a comprehensive MRSA colonization surveillance program. It is not intended to diagnose MRSA infection nor to guide or monitor treatment for MRSA infections.   CBC     Status: Abnormal   Collection Time: 02/14/15  3:15 AM  Result Value Ref Range   WBC 5.1 4.0 - 10.5 K/uL   RBC 3.84 (L) 4.22 - 5.81 MIL/uL   Hemoglobin 12.5 (L) 13.0 - 17.0 g/dL   HCT 36.8 (L) 39.0 - 52.0 %   MCV 95.8 78.0 - 100.0 fL   MCH 32.6 26.0 - 34.0 pg   MCHC 34.0 30.0 - 36.0 g/dL   RDW 12.9 11.5 - 15.5 %   Platelets 106 (L) 150 - 400 K/uL    Comment: SPECIMEN CHECKED FOR CLOTS REPEATED TO VERIFY PLATELET COUNT CONFIRMED BY SMEAR   Basic metabolic panel     Status: Abnormal   Collection Time: 02/14/15  3:15 AM  Result Value Ref Range   Sodium 138 135 - 145 mmol/L   Potassium 4.2 3.5 - 5.1 mmol/L   Chloride 108 101 - 111 mmol/L   CO2 22 22 - 32 mmol/L   Glucose, Bld 104 (H) 65 - 99 mg/dL   BUN 10 6 - 20 mg/dL   Creatinine, Ser 0.98 0.61 - 1.24 mg/dL   Calcium 7.9 (L) 8.9 - 10.3 mg/dL   GFR calc non Af Amer >60 >60 mL/min   GFR calc Af Amer >60 >60 mL/min    Comment: (NOTE) The eGFR has been calculated using the CKD EPI equation. This calculation has not been validated in all clinical situations. eGFR's persistently <60 mL/min signify possible Chronic Kidney Disease.    Anion gap 8 5 - 15   Recent Results (from the past 240 hour(s))  MRSA PCR Screening     Status: None   Collection Time: 02/13/15  7:40 PM  Result Value Ref Range Status   MRSA by PCR NEGATIVE NEGATIVE Final    Comment:        The GeneXpert MRSA Assay (FDA approved for NASAL specimens only), is one component of a comprehensive MRSA colonization surveillance program. It is not intended to diagnose MRSA infection nor to guide or monitor treatment for MRSA infections.    Creatinine:  Recent Labs  02/13/15 1146 02/14/15 0315  CREATININE 1.05 0.98   Baseline Creatinine: 1  Impression/Assessment:  80yo with BPH with  LUTS, urinary retention, difficult foley placement  Plan:  1. 74F coude catheter placed without incident 2. Pt should have a voiding trial in 5 days and followup with urology 1-2 weeks after discharge. If he is unable to void the foley should be replaced and he should be discharged home with the foley. Followup 1 week after discharge for a voiding trial  MCKENZIE, PATRICK L 02/14/2015, 8:17 AM

## 2015-02-14 NOTE — Progress Notes (Signed)
Utilization review completed.  

## 2015-02-15 ENCOUNTER — Encounter (HOSPITAL_COMMUNITY): Payer: Self-pay | Admitting: *Deleted

## 2015-02-15 LAB — TYPE AND SCREEN
ABO/RH(D): A POS
ANTIBODY SCREEN: NEGATIVE
Unit division: 0
Unit division: 0
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
## 2015-02-16 ENCOUNTER — Inpatient Hospital Stay (HOSPITAL_COMMUNITY): Payer: Medicare Other

## 2015-02-16 NOTE — Progress Notes (Signed)
Patient ambulated x1 unit with assistance.  Patient tolerated well, but still a little unstable on his feet.  Will continue to monitor patient.

## 2015-02-16 NOTE — Progress Notes (Signed)
Subjective: Patient reports doing well.  Mild Headache.  Objective: Vital signs in last 24 hours: Temp:  [97.4 F (36.3 C)-98.2 F (36.8 C)] 97.8 F (36.6 C) (11/01 0734) Pulse Rate:  [32-74] 41 (11/01 0500) Resp:  [7-29] 7 (11/01 0800) BP: (111-157)/(44-97) 135/69 mmHg (11/01 0800) SpO2:  [95 %-100 %] 100 % (11/01 0700)  Intake/Output from previous day: 10/31 0701 - 11/01 0700 In: 2250 [P.O.:240; I.V.:1800; IV Piggyback:210] Out: 2566 [Urine:2320; Drains:246] Intake/Output this shift: Total I/O In: 75 [I.V.:75] Out: -   Physical Exam: Awake, alert, conversant.  No drift. Right drain 11 cc last shift, 110 cc of CSF colored drainage from left drain.    Lab Results:  Recent Labs  02/13/15 1146 02/14/15 0315  WBC 5.3 5.1  HGB 14.8 12.5*  HCT 43.2 36.8*  PLT 136* 106*   BMET  Recent Labs  02/13/15 1146 02/14/15 0315  NA 141 138  K 4.1 4.2  CL 110 108  CO2 27 22  GLUCOSE 107* 104*  BUN 16 10  CREATININE 1.05 0.98  CALCIUM 8.9 7.9*    Studies/Results: Ct Head Wo Contrast  02/16/2015  CLINICAL DATA:  Follow-up examination for subdural hematoma is. EXAM: CT HEAD WITHOUT CONTRAST TECHNIQUE: Contiguous axial images were obtained from the base of the skull through the vertex without intravenous contrast. COMPARISON:  No prior CT is available for comparison purposes. FINDINGS: Postoperative changes from recent surgical evacuation of bilateral subdural hematomas are seen. Patient is status post bifrontal craniotomy with subdural drain placement. Subdural drains appear well positioned. Scattered pneumocephalus present within the extra-axial space bilaterally. A mixed attenuation left holo hemispheric subdural collection measures up to approximately 2 cm at the level of the left parietal convexity. This collection appears to contain acute and more subacute blood products. A right whole hemispheres subdural hematoma containing largely chronic subdural components but with some  acute blood products is also present. This measures up to 14 mm at the level of the right frontal lobe and 19 mm at the level of the right parietal lobe. Mild mass effect on the subjacent cerebral hemispheres. There is 4 mm of right-to-left shift. No hydrocephalus. Basilar cisterns are patent. No acute large vessel territory infarct. No intraparenchymal or subarachnoid hemorrhage. No mass lesion. Skin staples present 1 within the bilateral scalp related to recent craniotomy. No acute abnormality about the orbits. Sequela prior silicone injection seen at the right globe. Mild mucosal thickening within the anterior ethmoidal air cells. Paranasal sinuses are otherwise clear. No mastoid effusion. Middle ear cavities are clear. No other acute abnormality about the calvarium. IMPRESSION: 1. Postoperative changes from recent bilateral subdural hematoma evacuation without complication. Subdural drains in place. 2. Persistent bilateral holo hemispheric subdural hematomas, slightly larger on the right as compared to the left. 4 mm of right-to-left shift. No hydrocephalus. Electronically Signed   By: Rise MuBenjamin  McClintock M.D.   On: 02/16/2015 05:23    Assessment/Plan: Head CT shows some residual SDH, drains clearing.  Will D/C drains later today.  Mobilize, PT to work with patient.    LOS: 3 days    Dorian HeckleSTERN,Duyen Beckom D, MD 02/16/2015, 8:14 AM

## 2015-02-16 NOTE — Progress Notes (Signed)
Patient ID: Mariel AloeJohn Charles Dunnavant, male   DOB: [birth year], 80 y.o.   MRN: [id number] Alert, conversant. Reports some frustration with ambulation "I just don't have as much power".  MAEW. PEARL.  Bilat JP's removed without drainage from sites on Dr. Fredrich BirksStern's order. One staple each for drain site closure. Pt tolerated all well. Sites left open to air. OK to cleanse scalp, incisions, hair gently, leaving clean & dry.   Georgiann CockerBrian Jahvier Aldea RN BSN

## 2015-02-16 NOTE — Care Management Important Message (Signed)
Important Message  Patient Details  Name: Jerry AloeJohn Charles Brown MRN: [id number] Date of Birth: 10/22/1934   Medicare Important Message Given:  Yes-second notification given    Kyla BalzarineShealy, Natsuko Kelsay Abena 02/16/2015, 10:12 AM

## 2015-02-17 MED ORDER — OXYCODONE-ACETAMINOPHEN 5-325 MG PO TABS
1.0000 | ORAL_TABLET | Freq: Once | ORAL | Status: AC
Start: 2015-02-17 — End: 2015-02-17
  Administered 2015-02-17: 1 via ORAL
  Filled 2015-02-17: qty 1

## 2015-02-17 MED ORDER — LEVETIRACETAM 500 MG PO TABS
500.0000 mg | ORAL_TABLET | Freq: Two times a day (BID) | ORAL | Status: DC
  Administered 2015-02-17 – 2015-02-19 (×4): 500 mg via ORAL
  Filled 2015-02-17 (×4): qty 1

## 2015-02-17 MED ORDER — HYDROCODONE-ACETAMINOPHEN 5-325 MG PO TABS
1.0000 | ORAL_TABLET | ORAL | Status: DC | PRN
  Administered 2015-02-17 – 2015-02-18 (×2): 2 via ORAL
  Filled 2015-02-17 (×2): qty 2

## 2015-02-17 NOTE — Progress Notes (Signed)
Subjective: Patient reports doing ok  Objective: Vital signs in last 24 hours: Temp:  [97.7 F (36.5 C)-99.1 F (37.3 C)] 99.1 F (37.3 C) (11/02 0400) Pulse Rate:  [27-65] 43 (11/02 0700) Resp:  [9-30] 14 (11/02 0700) BP: (119-173)/(41-88) 132/58 mmHg (11/02 0700) SpO2:  [94 %-98 %] 96 % (11/02 0700)  Intake/Output from previous day: 11/01 0701 - 11/02 0700 In: 1935 [I.V.:1725; IV Piggyback:210] Out: 3861 [Urine:3750; Drains:111] Intake/Output this shift:    Physical Exam: Minimal drift Left upper extremity.  Dressings CDI.  Some imbalance with ambulation.  Lab Results: No results for input(s): WBC, HGB, HCT, PLT in the last 72 hours. BMET No results for input(s): NA, K, CL, CO2, GLUCOSE, BUN, CREATININE, CALCIUM in the last 72 hours.  Studies/Results: Ct Head Wo Contrast  02/16/2015  CLINICAL DATA:  Follow-up examination for subdural hematoma is. EXAM: CT HEAD WITHOUT CONTRAST TECHNIQUE: Contiguous axial images were obtained from the base of the skull through the vertex without intravenous contrast. COMPARISON:  No prior CT is available for comparison purposes. FINDINGS: Postoperative changes from recent surgical evacuation of bilateral subdural hematomas are seen. Patient is status post bifrontal craniotomy with subdural drain placement. Subdural drains appear well positioned. Scattered pneumocephalus present within the extra-axial space bilaterally. A mixed attenuation left holo hemispheric subdural collection measures up to approximately 2 cm at the level of the left parietal convexity. This collection appears to contain acute and more subacute blood products. A right whole hemispheres subdural hematoma containing largely chronic subdural components but with some acute blood products is also present. This measures up to 14 mm at the level of the right frontal lobe and 19 mm at the level of the right parietal lobe. Mild mass effect on the subjacent cerebral hemispheres. There is 4  mm of right-to-left shift. No hydrocephalus. Basilar cisterns are patent. No acute large vessel territory infarct. No intraparenchymal or subarachnoid hemorrhage. No mass lesion. Skin staples present 1 within the bilateral scalp related to recent craniotomy. No acute abnormality about the orbits. Sequela prior silicone injection seen at the right globe. Mild mucosal thickening within the anterior ethmoidal air cells. Paranasal sinuses are otherwise clear. No mastoid effusion. Middle ear cavities are clear. No other acute abnormality about the calvarium. IMPRESSION: 1. Postoperative changes from recent bilateral subdural hematoma evacuation without complication. Subdural drains in place. 2. Persistent bilateral holo hemispheric subdural hematomas, slightly larger on the right as compared to the left. 4 mm of right-to-left shift. No hydrocephalus. Electronically Signed   By: Rise MuBenjamin  McClintock M.D.   On: 02/16/2015 05:23    Assessment/Plan: Transfer to floor.  Mobilize with PT.  D/C when up and ambulating.  Social Work to consider disposition/home situation issues.    LOS: 4 days    Dorian HeckleSTERN,Tyrell Brereton D, MD 02/17/2015, 8:11 AM
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
## 2015-02-17 NOTE — Clinical Social Work Note (Signed)
Clinical Social Work Assessment  Patient Details  Name: Jerry Brown MRN: [id number] Date of Birth: Sep 22, 1934  Date of referral:  02/17/15               Reason for consult:  Discharge Planning, Substance Use/ETOH Abuse                Permission sought to share information with:  Other Permission granted to share information::  No  Name::        Agency::     Relationship::     Contact Information:     Housing/Transportation Living arrangements for the past 2 months:  Apartment Source of Information:  Patient Patient Interpreter Needed:  None Criminal Activity/Legal Involvement Pertinent to Current Situation/Hospitalization:  No - Comment as needed Significant Relationships:  Adult Children Lives with:  Self Do you feel safe going back to the place where you live?  Yes Need for family participation in patient care:  No (Coment)  Care giving concerns:  Patient is adamant that there is no need to be concerned about him returning home at discharge. He states he will be returning home and "that's that."   Facilities manager / plan:  CSW met with patient at bedside to complete assessment. CSW was consulted for substance abuse as patient has a long history of ETOH use. Patient presented to the hospital after multiple falls at home. He was found to have bilateral subdural hematomas and underwent a bilateral craniotomy subdural hematoma evacuation. Per chart the patient appears to be recovering well. CSW introduced self and reason for visit. CSW met immediate resistance from the patient when the patient's alcohol use was introduced. The patient states that he is tired of being asked about his alcohol/drug use as he "quit 10 days ago." Through complex reflection, CSW was able to illicit more information from the patient regarding his alcohol use. He shares that he has been drinking "for the past 50 years." He has been drinking 90 proof vodka, and per his report, drinks 2 shots of this  every morning. The patient denies needing to drink in the morning, and says that this is just when he chooses to drink. The patient says that he has not had any difficulty with abstaining from alcohol for the "past ten days." He denies any other substance use.   CSW inquired about the patient's support system, which appears to be very limited. He currently lives alone, but does have a daughter in [place] who has offered to let him live with her. The patient becomes noticeably upset from any mentioning of going to a facility at discharge or going anywhere else but his own home. He states, "I've lived alone this whole time and that's not changing." The patient has never engaged in any kind of substance abuse treatment for his alcohol use and does not have any intention of doing so in the future. He states he can easily abstain and will do so. Unfortunately UDS was not performed on patient at time of admission, so unclear if he was drinking the day he was brought to the hospital. In the patient's eyes his alcohol use is not a problem and does not need to addressed any further.  Lastly, the patient does share that he has been experiencing multiple falls at home. CSW explored potential options for patient at discharge, but he is adamantly refusing any type of placement at discharge. He plans to return home alone, and appears capable of making this decision.  SBIRT completed with patient. CSW has updated unit CSW of situation. CSW signing off at this time. Please re-consult if necessary.   Employment status:  Retired Forensic scientist:  Medicare PT Recommendations:  Not assessed at this time [place] / Referral to community resources:  Other (Comment Required), SBIRT (Resources offered but patient refused.)  Patient/Family's Response to care: The patient appears to be satisfied with the care he has received here at [facility]. Although he is not receptive to the idea of going to treatment for his  alcohol use, he expressed appreciation for CSW's time.   Patient/Family's Understanding of and Emotional Response to Diagnosis, Current Treatment, and Prognosis:  It's unclear if the patient is being forthcoming about the amount or frequency of his use, but he makes it clear that he does not plan to engage in any kind of treatment at this time. He appears to be in the pre-contemplative stage of change as he doesn't acknowledge how his current alcohol use could present some problems in regards to his health. CSW counseled patient on the impact of his alcohol use on his health and the importance of cutting down and/or quitting. Patient continues to insist he does not have a problem as he has already stopped.  Emotional Assessment Appearance:  Appears stated age Attitude/Demeanor/Rapport:  Avoidant, Guarded, Other (Patient is irritable) Affect (typically observed):  Irritable, Other, Frustrated Orientation:  Oriented to Self, Oriented to Place, Oriented to  Time, Oriented to Situation Alcohol / Substance use:  Alcohol Use (Denies any use of other substance) Psych involvement (Current and /or in the community):  No (Comment)  Discharge Needs  Concerns to be addressed:  Substance Abuse Concerns Readmission within the last 30 days:  No Current discharge risk:  Substance Abuse, Lack of support system, Lives alone Barriers to Discharge:  Continued Medical Work up  [employer] MSW, Uncertain, [place], [phone]

## 2015-02-17 NOTE — Evaluation (Signed)
Physical Therapy Evaluation Patient Details Name: Jerry AloeJohn Charles Brown MRN: [id number] DOB: 11/24/1934 Today's Date: 02/17/2015   History of Present Illness  pt presents with hx of multiples falls and was found to have Bil SDHs now s/p Bil Craniotomies.  pt with hx of Seizures and R eye blind.    Clinical Impression  Pt clearly not safe for return to home alone with pt admitting to multiple falls and injuries from falls, but pt is adamant that he will be returning to his home by himself at D/C.  Attempted to encourage pt to consider rehabs for balance and to decrease fall risk, but pt states that wouldn't do him any good.  [facility] very [facility] after [facility]C.  Feel pt would be best served at SNF level of care, however as pt is adamant about D/C to home, will need to maximize home health services.  Recommend pt use a RW, but doubtful that he would actually use one.  Will continue to follow while on acute.      Follow Up Recommendations Home health PT;Supervision/Assistance - 24 hour ([facility])    Equipment Recommendations  Rolling walker with 5" wheels    Recommendations for Other Services       Precautions / Restrictions Precautions Precautions: Fall Restrictions Weight Bearing Restrictions: No      Mobility  Bed Mobility Overal bed mobility: Modified Independent                Transfers Overall transfer level: Needs assistance Equipment used: None Transfers: Sit to/from Stand Sit to Stand: Min guard         General transfer comment: pt unsteady and with definite use of his UEs.  pt declines AD.  pt alos performed a semi-floor transfer by going down on all floor and used bed to A with bringing self back to sitting on EOB.    Ambulation/Gait Ambulation/Gait assistance: Min assist;Min guard Ambulation Distance (Feet): 160 Feet Assistive device:  (IV pole and hallway rail) Gait Pattern/deviations: Step-through  pattern;Decreased stride length;Drifts right/left;Shuffle     General Gait Details: pt fluctuates between MinG and MinA to maintain balance.  pt declined use of AD, but was clearly needing IV pole and occasional use of hallway railing to provide support.  When asked about his unsteadiness, pt indicates he is not unsteady and is just fine.    Stairs            Wheelchair Mobility    Modified Rankin (Stroke Patients Only)       Balance Overall balance assessment: History of Falls;Needs assistance Sitting-balance support: No upper extremity supported;Feet supported Sitting balance-Leahy Scale: Good     Standing balance support: No upper extremity supported;Single extremity supported Standing balance-Leahy Scale: Fair                               Pertinent Vitals/Pain Pain Assessment: No/denies pain    Home Living Family/patient expects to be discharged to:: Private residence Living Arrangements: Alone Available Help at Discharge: Family;Friend(s);Available PRN/intermittently (pt indicates family and friends can check on him.  ) Type of Home: Apartment Home Access: Level entry     Home Layout: One level Home Equipment: None      Prior Function Level of Independence: Independent               Hand Dominance  Extremity/Trunk Assessment   Upper Extremity Assessment: Generalized weakness           Lower Extremity Assessment: Generalized weakness      Cervical / Trunk Assessment: Kyphotic  Communication   Communication: No difficulties  Cognition Arousal/Alertness: Awake/alert Behavior During Therapy: Restless Overall Cognitive Status: No family/caregiver present to determine baseline cognitive functioning                      General Comments      Exercises        Assessment/Plan    PT Assessment Patient needs continued PT services  PT Diagnosis Difficulty walking   PT Problem List Decreased  strength;Decreased activity tolerance;Decreased balance;Decreased mobility;Decreased coordination;Decreased knowledge of use of DME  PT Treatment Interventions DME instruction;Gait training;Functional mobility training;Therapeutic activities;Therapeutic exercise;Balance training;Neuromuscular re-education;Patient/family education   PT Goals (Current goals can be found in the Care Plan section) Acute Rehab PT Goals Patient Stated Goal: Home ASAP PT Goal Formulation: With patient Time For Goal Achievement: 03/03/15 Potential to Achieve Goals: Good    Frequency Min 3X/week   Barriers to discharge Decreased caregiver support      Co-evaluation               End of Session Equipment Utilized During Treatment: Gait belt Activity Tolerance: Patient tolerated treatment well Patient left: in bed;with call bell/phone within reach;with bed alarm set Nurse Communication: Mobility status         Time: 1321-1340 PT Time Calculation (min) (ACUTE ONLY): 19 min   Charges:   PT Evaluation $Initial PT Evaluation Tier I: 1 Procedure     PT G CodesSunny Schlein, [place] [phone] 02/17/2015, 2:33 PM

## 2015-02-18 NOTE — Progress Notes (Signed)
Subjective: Patient reports "I'm about half way...better than it could be"  Objective: Vital signs in last 24 hours: Temp:  [96.6 F (35.9 C)-98.2 F (36.8 C)] 96.6 F (35.9 C) (11/03 0521) Pulse Rate:  [31-89] 88 (11/03 0521) Resp:  [9-16] 16 (11/03 0521) BP: (117-165)/(62-114) 146/62 mmHg (11/03 0521) SpO2:  [91 %-98 %] 97 % (11/03 0521)  Intake/Output from previous day: 11/02 0701 - 11/03 0700 In: 255 [I.V.:150; IV Piggyback:105] Out: 3600 [Urine:3600] Intake/Output this shift:    Alert, conversant. Reports no pain, no dizziness, and improved strength.  Incisions without erythema, swelling, or drainage. Staples intact/edges well-approximated. PEARL. No drift. Tongue protrudes midline. MAEW.  Foley patent - voiding trial planned for tomorrow.   Lab Results: No results for input(s): WBC, HGB, HCT, PLT in the last 72 hours. BMET No results for input(s): NA, K, CL, CO2, GLUCOSE, BUN, CREATININE, CALCIUM in the last 72 hours.  Studies/Results: No results found.  Assessment/Plan: Improving   LOS: 5 days  Per DrStern, continue to mobilize with PT. Plan per urology to d/c Foley tomorrow morning for voiding trial before d/c to home (replacing Foley if unable to void & f/u with Urology in 1 week).  Pt reluctantly agreeable to HHPT on d/c.    Georgiann Cockeroteat, Chadd Tollison 02/18/2015, 7:58 AM

## 2015-02-18 NOTE — Progress Notes (Signed)
Occupational Therapy Evaluation Patient Details Name: Jerry Brown MRN: [id number] DOB: 12-26-1934 Today's Date: 02/18/2015    History of Present Illness pt presents with hx of multiples falls and was found to have Bil SDHs now s/p Bil Craniotomies.  pt with hx of Seizures and R eye blind.     Clinical Impression   Pt admitted with the above diagnoses and presents with below problem list. Pt will benefit from continued acute OT to address the below listed deficits and maximize independence with BADLs. PTA pt was independent with ADLs (h/o falls). Pt is currently min guard to min A with LB ADLs, toilet transfers, and functional mobility.  Mod A with tub shower transfers. Pt refuses SNF recommendation. Educated on fall prevention and home setup. Session details below. OT to continue to follow acutely.      Follow Up Recommendations  Supervision/Assistance - 24 hour;Home health OT;Other (comment) (Pt declined SNF recommendation. [facility].)    Equipment Recommendations  3 in 1 bedside comode    Recommendations for Other Services       Precautions / Restrictions Precautions Precautions: Fall Restrictions Weight Bearing Restrictions: No      Mobility Bed Mobility Overal bed mobility: Modified Independent                Transfers Overall transfer level: Needs assistance Equipment used: Rolling walker (2 wheeled) Transfers: Sit to/from Stand Sit to Stand: Min guard         General transfer comment: cues for technique and safe use of rw. Weakness and unsteadiness noted. From EOB.    Balance Overall balance assessment: History of Falls;Needs assistance Sitting-balance support: No upper extremity supported;Feet supported Sitting balance-Leahy Scale: Good Sitting balance - Comments: able to adjust socks in sitting position with some effort   Standing balance support: Bilateral upper extremity supported;During functional activity Standing balance-Leahy  Scale: Fair Standing balance comment: stood to complete hand washing at sink                            ADL Overall ADL's : Needs assistance/impaired Eating/Feeding: Set up;Sitting   Grooming: Min guard;Standing;Wash/dry hands Grooming Details (indicate cue type and reason): external support for balance Upper Body Bathing: Set up;Sitting   Lower Body Bathing: Min guard;Minimal assistance;Sit to/from stand   Upper Body Dressing : Set up;Sitting   Lower Body Dressing: Min guard;Minimal assistance;Sit to/from stand   Toilet Transfer: Min guard;Minimal assistance;Ambulation;Comfort height toilet;RW   Toileting- Clothing Manipulation and Hygiene: Min guard;Minimal assistance;Sit to/from stand   Tub/ Shower Transfer: Tub transfer;Ambulation;Moderate assistance;Rolling walker;Shower seat   Functional mobility during ADLs: Min guard;Rolling walker;Cueing for safety General ADL Comments: Pt reluctant to use rw. Decreased safety awareness. Discussed fall prevention including home setup. Educated pt on benefit of having something to sit down on to take a shower. Also educated on keeping pathways clear with pt reporting that he has towels laying all around bathroom floor. Strongly advised having towels removed. Pt continues to adamently decline SNF at d/c.      Vision     Perception     Praxis      Pertinent Vitals/Pain Pain Assessment: Faces Faces Pain Scale: Hurts little more Pain Location: "I hurt everywhere" "It's half" Pt c/o headache once sitting EOB. Reports pain in right shoulder sometimes. Pain Descriptors / Indicators: Headache Pain Intervention(s): Limited activity within patient's tolerance;Monitored during session;Repositioned     Hand Dominance  Extremity/Trunk Assessment Upper Extremity Assessment Upper Extremity Assessment: Generalized weakness;RUE deficits/detail RUE Deficits / Details: Pt reports intermittent pain in right shoulder "when I move it  certain ways." Pt reported pain with about 45 degrees shoulder flexion in sitting position. When pt was reclined in bed noted to have full AROM with no pain with right hand resting on head.  RUE: Unable to fully assess due to pain   Lower Extremity Assessment Lower Extremity Assessment: Defer to PT evaluation;Generalized weakness   Cervical / Trunk Assessment Cervical / Trunk Assessment: Kyphotic   Communication Communication Communication: No difficulties   Cognition Arousal/Alertness: Awake/alert Behavior During Therapy: Restless Overall Cognitive Status: No family/caregiver present to determine baseline cognitive functioning                     General Comments       Exercises       Shoulder Instructions      Home Living Family/patient expects to be discharged to:: Private residence Living Arrangements: Alone Available Help at Discharge: Family;Friend(s);Available PRN/intermittently Type of Home: Apartment Home Access: Level entry     Home Layout: One level     Bathroom Shower/Tub: Chief Strategy OfficerTub/shower unit   Bathroom Toilet: Standard     Home Equipment: Grab bars - tub/shower          Prior Functioning/Environment Level of Independence: Independent             OT Diagnosis: Generalized weakness;Cognitive deficits;Acute pain   OT Problem List: Decreased strength;Decreased range of motion;Decreased activity tolerance;Impaired balance (sitting and/or standing);Decreased cognition;Decreased safety awareness;Decreased knowledge of use of DME or AE;Decreased knowledge of precautions;Impaired UE functional use   OT Treatment/Interventions: Self-care/ADL training;DME and/or AE instruction;Therapeutic activities;Patient/family education;Balance training    OT Goals(Current goals can be found in the care plan section) Acute Rehab OT Goals Patient Stated Goal: Home ASAP OT Goal Formulation: With patient Time For Goal Achievement: 02/25/15 Potential to Achieve  Goals: Good ADL Goals Pt Will Perform Grooming: with modified independence;standing Pt Will Perform Upper Body Bathing: with modified independence;sitting Pt Will Perform Lower Body Bathing: with modified independence;sit to/from stand;with adaptive equipment Pt Will Perform Upper Body Dressing: with modified independence;sitting Pt Will Perform Lower Body Dressing: with modified independence;sit to/from stand Pt Will Transfer to Toilet: with modified independence;ambulating;regular height toilet;grab bars Pt Will Perform Toileting - Clothing Manipulation and hygiene: with modified independence;sit to/from stand;sitting/lateral leans Pt Will Perform Tub/Shower Transfer: Tub transfer;with modified independence;3 in 1;ambulating;rolling walker  OT Frequency: Min 2X/week   Barriers to D/C: Decreased caregiver support          Co-evaluation              End of Session Equipment Utilized During Treatment: Rolling walker;Gait belt  Activity Tolerance: Patient tolerated treatment well Patient left: in bed;with call bell/phone within reach;with bed alarm set   Time: [id number] OT Time Calculation (min): 23 min Charges:  OT General Charges $OT Visit: 1 Procedure OT Evaluation $Initial OT Evaluation Tier I: 1 Procedure OT Treatments $Self Care/Home Management : 8-22 mins G-Codes:    Pilar GrammesMathews, Johm Pfannenstiel H 02/18/2015, 10:16 AM

## 2015-02-18 NOTE — Progress Notes (Signed)
CM consult received regarding need for medication assistance. CM met with patient, who states that he does not know of any issues obtaining medications.  CM will continue to follow for any discharge needs.

## 2015-02-19 NOTE — Care Management Important Message (Signed)
Important Message  Patient Details  Name: Jerry AloeJohn Charles Brown MRN: [id number] Date of Birth: 04/21/1934   Medicare Important Message Given:  Yes-third notification given    Orson AloeMegan P Genevia Bouldin 02/19/2015, 1:27 PM

## 2015-02-19 NOTE — Progress Notes (Signed)
Subjective: Patient reports "I've been ready to go" "I feel ok I dont hurt"  Objective: Vital signs in last 24 hours: Temp:  [97.6 F (36.4 C)-98.7 F (37.1 C)] 98.7 F (37.1 C) (11/04 1021) Pulse Rate:  [54-77] 54 (11/04 1021) Resp:  [18-20] 18 (11/04 1021) BP: (128-156)/(56-90) 140/69 mmHg (11/04 1021) SpO2:  [95 %-100 %] 100 % (11/04 1021)  Intake/Output from previous day: 11/03 0701 - 11/04 0700 In: 240 [P.O.:240] Out: 1600 [Urine:1600] Intake/Output this shift: Total I/O In: -  Out: 3400 [Urine:3400]  Alert, conversant. Denies headache. Reports no difficulty voiding - urinal at bedside ~650ml yellow.  No drift. Staples intact and skin edges well approximated bilat crani sites. No bleeding, swelling, or erythema.   Lab Results: No results for input(s): WBC, HGB, HCT, PLT in the last 72 hours. BMET No results for input(s): NA, K, CL, CO2, GLUCOSE, BUN, CREATININE, CALCIUM in the last 72 hours.  Studies/Results: No results found.  Assessment/Plan: Improved   LOS: 6 days  Per DrStern, d/c IV, d/c to home with HHPT.  Pt agrees to HHPT. Pt also verbalizes understanding of d/c instructions and need to visit DrStern in office in 2weeks for staple removal.  No Rx's needed. Stop Keppra.    Georgiann Cockeroteat, Rainey Kahrs 02/19/2015, 2:38 PM

## 2015-02-19 NOTE — Progress Notes (Signed)
Pt discharged home with family and friends. IV removed, discharge instructions given. Pt aware home health not yet set up. Does not wish to wait. Left unit via wheelchair with volunteer services at 1435. Jerry Brown

## 2015-02-19 NOTE — Progress Notes (Signed)
Patient ID: Jerry AloeJohn Charles Brown, male   DOB: 1935-02-04, 80 y.o.   MRN: [id number] Spoke with Dr. Ronne BinningMcKenzie [facility]( [facility]).  On his order, I will visit after surgeries today & d/c  Foley (Coude) Catheter. Plan will be to d/c home without catheter if he is able to void - with catheter and urology follow up if he is unable.  Georgiann CockerBrian Kymberlee Viger RN BSN

## 2015-02-19 NOTE — Discharge Summary (Signed)
Physician Discharge Summary  Patient ID: Jerry AloeJohn Charles Brown MRN: [id number] DOB/AGE: 1935/02/04 80 y.o.  Admit date: 02/13/2015 Discharge date: 02/19/2015  Admission Diagnoses: bilateral subdural hematomas   Discharge Diagnoses: bilateral subdural hematomas  s/p BILATERAL CRANIOTOMY HEMATOMA EVACUATION SUBDURAL (Bilateral)  Active Problems:   Subdural hematoma [facility](HCC)   Discharged Condition: good  Hospital Course: [name] was admitted for craniotomies for evacuation of bilateral subdural hematomas.  Following uncomplicated surgery, he recovered well, transferred to Neuro ICU and eventually 5C.  He has progressed steadily.  Urology (Dr. Ronne BinningMcKenzie) placed Coude Foley preoperatively, which was removed today with no subsequent problems voiding.   Consults: urology  Significant Diagnostic Studies:   Treatments: surgery: BILATERAL CRANIOTOMY HEMATOMA EVACUATION SUBDURAL (Bilateral)   Discharge Exam: Blood pressure 140/69, pulse 54, temperature 98.7 F (37.1 C), temperature source Oral, resp. rate 18, height 5\' 9"  (1.753 m), weight 79.6 kg (175 lb 7.8 oz), SpO2 100 %. Alert, conversant. Denies headache. Reports no difficulty voiding - urinal at bedside ~5750ml yellow.  No drift. Staples intact and skin edges well approximated bilat crani sites. No bleeding, swelling, or erythema.    Disposition: Discharge to home.  Pt agrees to HHPT. Pt also verbalizes understanding of d/c instructions and need to visit DrStern in office in 2weeks for staple removal.  No Rx's needed. Stop Keppra.       Medication List    TAKE these medications        oxyCODONE-acetaminophen 5-325 MG tablet  Commonly known as:  PERCOCET/ROXICET  Take 1 tablet by mouth once.         Signed: Georgiann Cockeroteat, [name] 02/19/2015, 2:45 PM

## 2015-02-19 NOTE — Progress Notes (Signed)
Patient being discharged to home with home health PT. [facility]

## 2015-03-09 ENCOUNTER — Other Ambulatory Visit: Payer: Self-pay | Admitting: Neurosurgery

## 2015-03-09 DIAGNOSIS — S065XAA Traumatic subdural hemorrhage with loss of consciousness status unknown, initial encounter: Secondary | ICD-10-CM

## 2015-03-09 DIAGNOSIS — S065X9A Traumatic subdural hemorrhage with loss of consciousness of unspecified duration, initial encounter: Secondary | ICD-10-CM

## 2015-03-18 ENCOUNTER — Other Ambulatory Visit: Payer: Medicare Other

## 2015-03-23 ENCOUNTER — Ambulatory Visit
Admission: RE | Admit: 2015-03-23 | Discharge: 2015-03-23 | Disposition: A | Discharge status: Home / Self Care | Payer: Medicare Other | Source: Ambulatory Visit | Attending: Neurosurgery | Admitting: Neurosurgery

## 2015-03-23 DIAGNOSIS — S065XAA Traumatic subdural hemorrhage with loss of consciousness status unknown, initial encounter: Secondary | ICD-10-CM

## 2015-03-23 DIAGNOSIS — S065X9A Traumatic subdural hemorrhage with loss of consciousness of unspecified duration, initial encounter: Secondary | ICD-10-CM

## 2015-03-24 ENCOUNTER — Inpatient Hospital Stay: Admission: RE | Admit: 2015-03-24 | Payer: Medicare Other | Source: Ambulatory Visit

## 2015-05-10 ENCOUNTER — Other Ambulatory Visit: Payer: Self-pay | Admitting: Neurosurgery

## 2015-05-10 ENCOUNTER — Other Ambulatory Visit: Payer: Medicare Other

## 2015-05-10 DIAGNOSIS — I6200 Nontraumatic subdural hemorrhage, unspecified: Secondary | ICD-10-CM

## 2015-05-17 ENCOUNTER — Ambulatory Visit
Admission: RE | Admit: 2015-05-17 | Discharge: 2015-05-17 | Disposition: A | Discharge status: Home / Self Care | Payer: Medicare HMO | Source: Ambulatory Visit | Attending: Neurosurgery | Admitting: Neurosurgery

## 2015-05-17 DIAGNOSIS — I6200 Nontraumatic subdural hemorrhage, unspecified: Secondary | ICD-10-CM

## 2016-12-15 IMAGING — CT CT HEAD W/O CM
1 series · 15 of 30 positions shown, 19 images · non-contrast
Comparison: No prior CT is available for comparison purposes.

CLINICAL DATA: Follow-up examination for subdural hematoma is.

EXAM:
CT HEAD WITHOUT CONTRAST
TECHNIQUE: Contiguous axial images were obtained from the base of the skull
through the vertex without intravenous contrast.

[Series 2: head 5.0 h30s · axial · 0.42mm/px · z∈[-142,+3]mm · 15 of 33 slices shown, 19 images]
[im 2/33  brain]
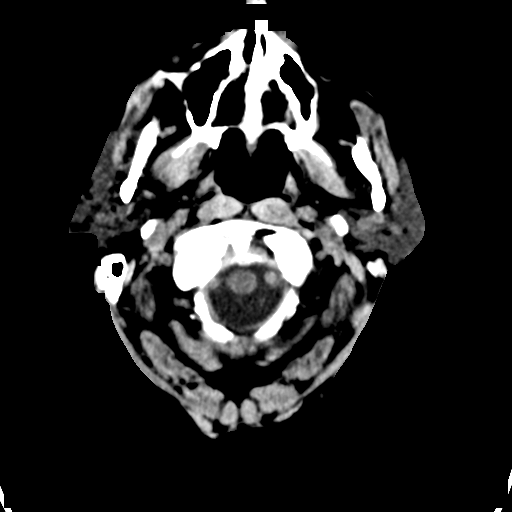
[im 2/33  bone]
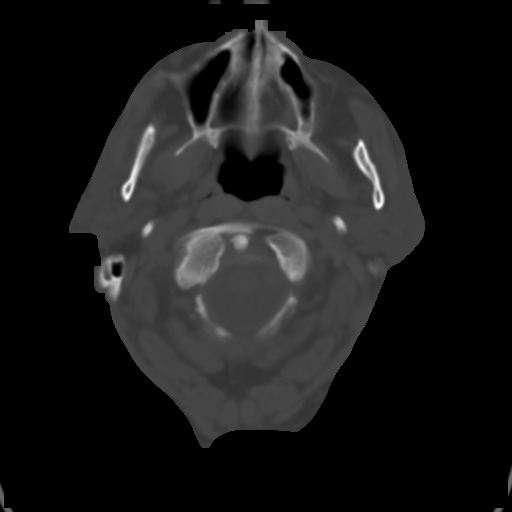
[im 4/33  brain]
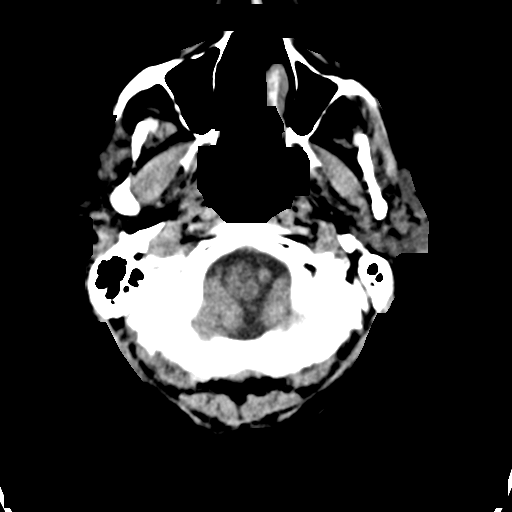
[im 6/33  brain]
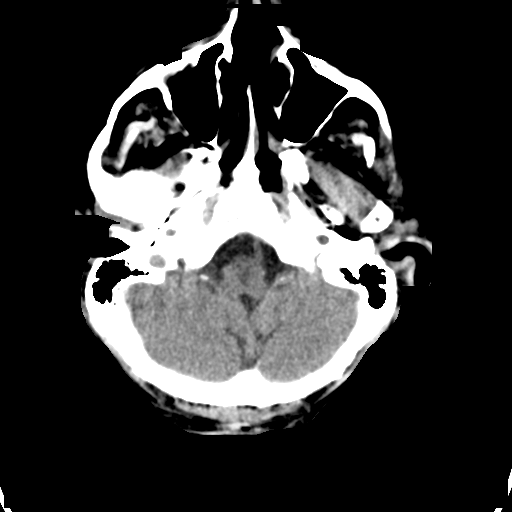
[im 8/33  brain]
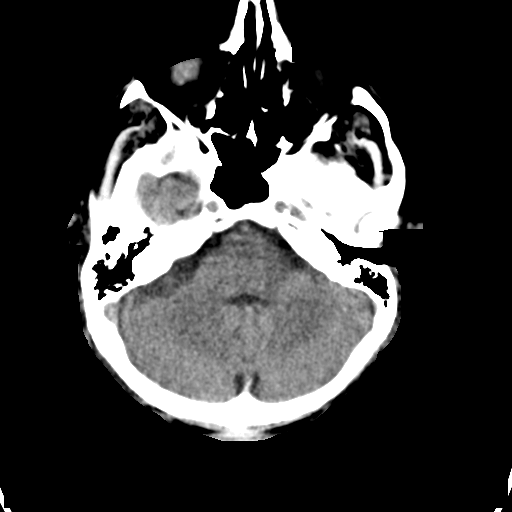
[im 10/33  brain]
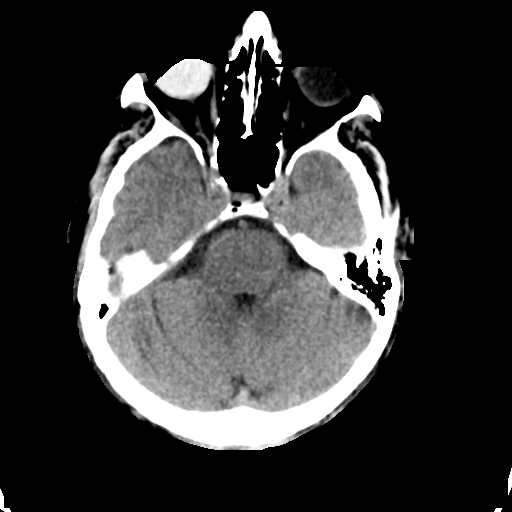
[im 10/33  bone]
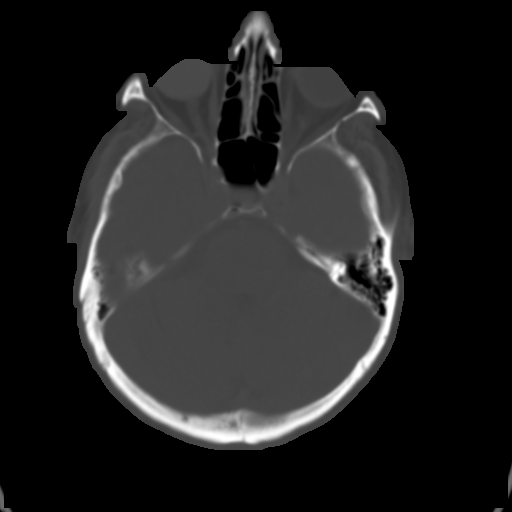
[im 13/33  brain]
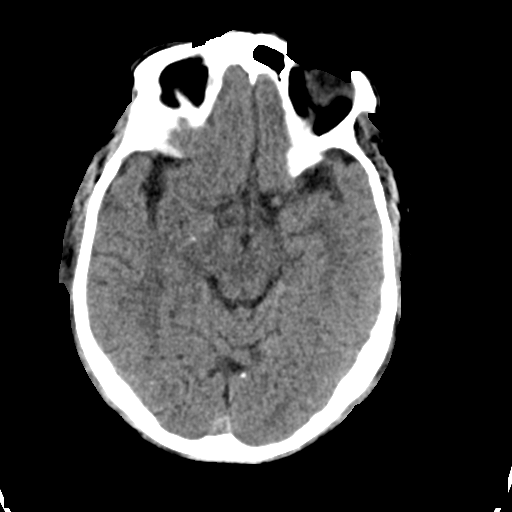
[im 15/33  brain]
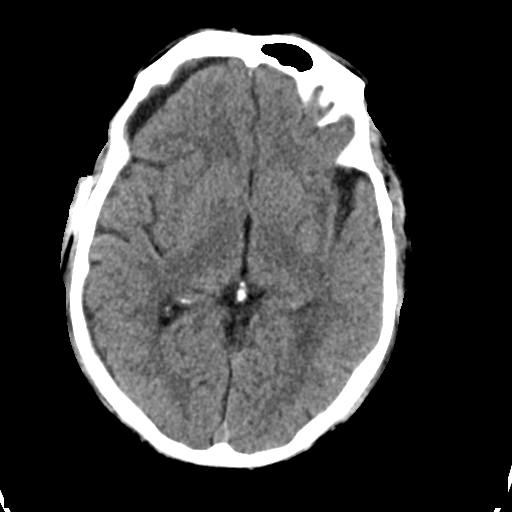
[im 17/33  brain]
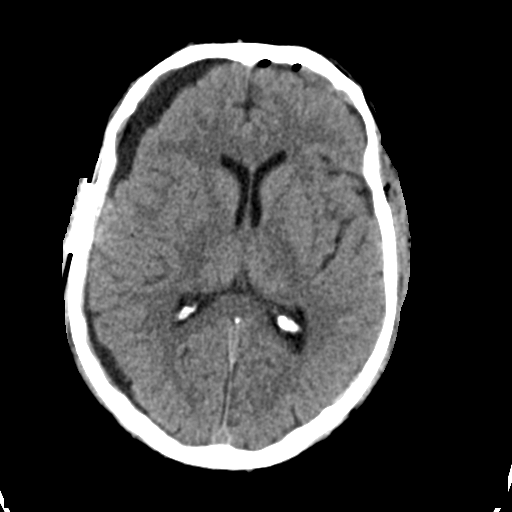
[im 18/33  brain]
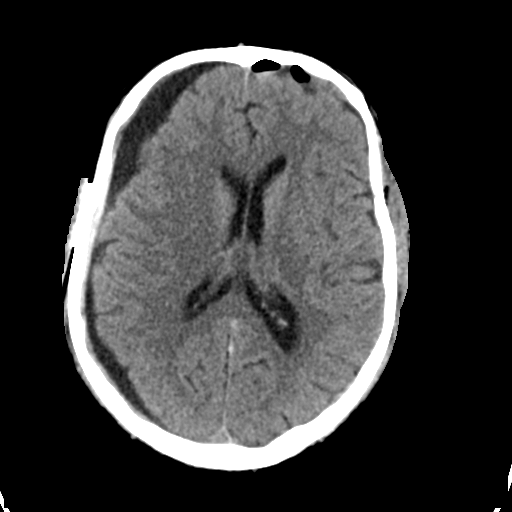
[im 18/33  bone]
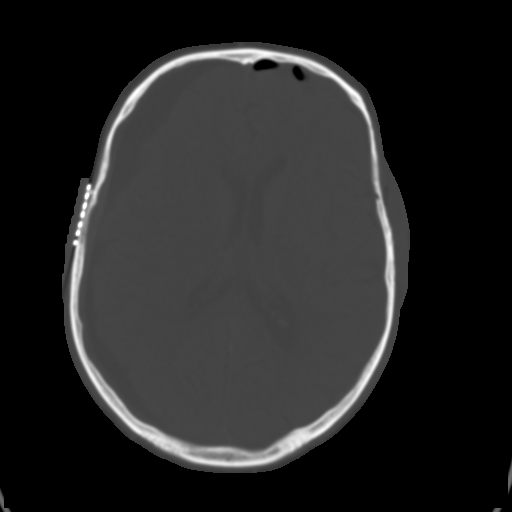
[im 20/33  brain]
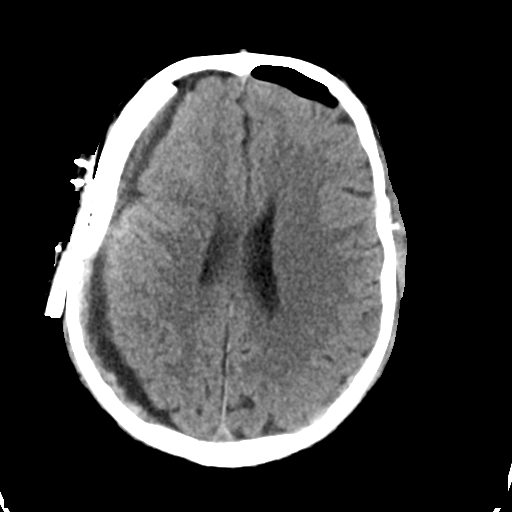
[im 23/33  brain]
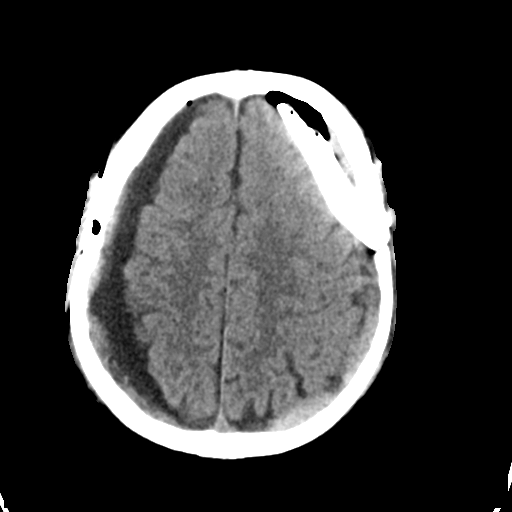
[im 25/33  brain]
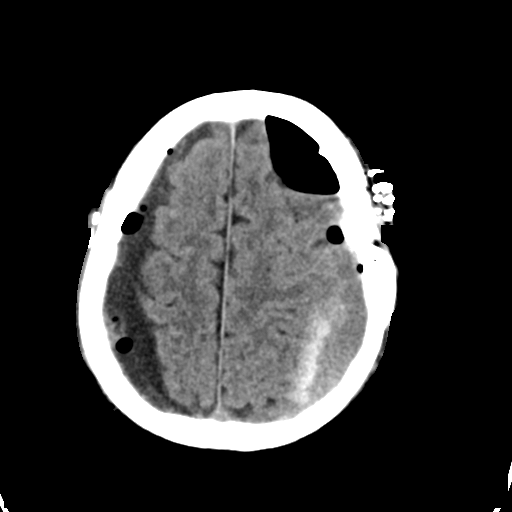
[im 27/33  brain]
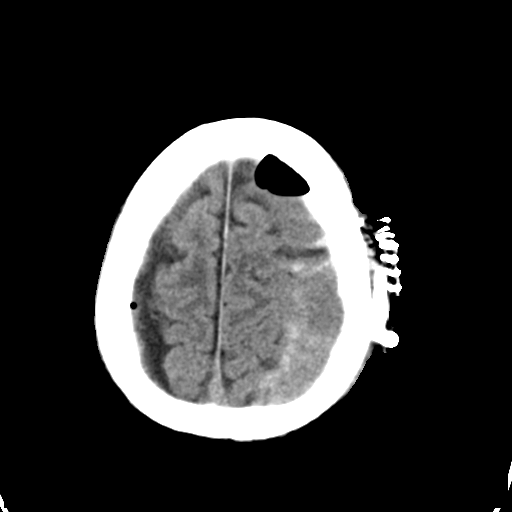
[im 27/33  bone]
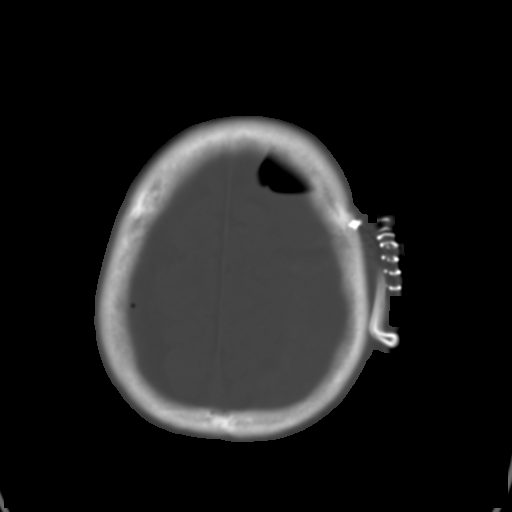
[im 29/33  brain]
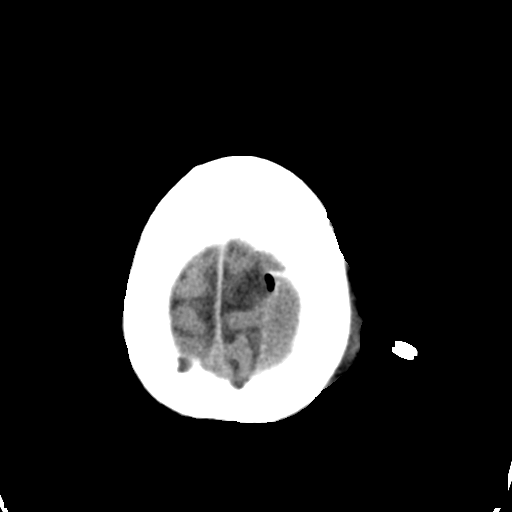
[im 31/33  brain]
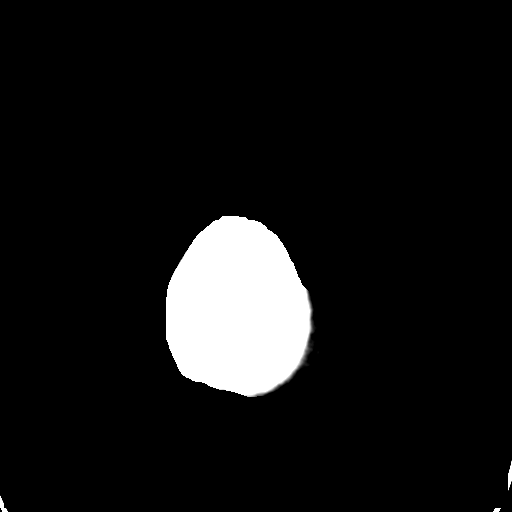

[15 of 30 positions shown; findings below may reference images not displayed]

FINDINGS: Postoperative changes from recent surgical evacuation of bilateral
subdural hematomas are seen. Patient is status post bifrontal
craniotomy with subdural drain placement. Subdural drains appear
well positioned. Scattered pneumocephalus present within the
extra-axial space bilaterally. A mixed attenuation left holo
hemispheric subdural collection measures up to approximately 2 cm at
the level of the left parietal convexity. This collection appears to
contain acute and more subacute blood products. A right whole
hemispheres subdural hematoma containing largely chronic subdural
components but with some acute blood products is also present. This
measures up to 14 mm at the level of the right frontal lobe and 19
mm at the level of the right parietal lobe. Mild mass effect on the
subjacent cerebral hemispheres. There is 4 mm of right-to-left
shift. No hydrocephalus. Basilar cisterns are patent.

No acute large vessel territory infarct. No intraparenchymal or
subarachnoid hemorrhage. No mass lesion.

Skin staples present 1 within the bilateral scalp related to recent
craniotomy. No acute abnormality about the orbits. Sequela prior
silicone injection seen at the right globe.

Mild mucosal thickening within the anterior ethmoidal air cells.
Paranasal sinuses are otherwise clear. No mastoid effusion. Middle
ear cavities are clear.

No other acute abnormality about the calvarium.
IMPRESSION: 1. Postoperative changes from recent bilateral subdural hematoma
evacuation without complication. Subdural drains in place.
2. Persistent bilateral holo hemispheric subdural hematomas,
slightly larger on the right as compared to the left. 4 mm of
right-to-left shift. No hydrocephalus.

## 2019-03-12 ENCOUNTER — Encounter: Attending: Urology | Primary: Internal Medicine

## 2019-03-17 ENCOUNTER — Encounter: Attending: Urology | Primary: Internal Medicine

## 2019-03-28 ENCOUNTER — Ambulatory Visit: Attending: Family | Primary: Internal Medicine

## 2019-03-28 ENCOUNTER — Ambulatory Visit
Admit: 2019-03-28 | Discharge: 2019-03-28 | Payer: PRIVATE HEALTH INSURANCE | Attending: Family | Primary: Internal Medicine

## 2019-03-28 DIAGNOSIS — R339 Retention of urine, unspecified: Secondary | ICD-10-CM

## 2019-03-28 LAB — AMB POC URINALYSIS DIP STICK AUTO W/O MICRO
Glucose (UA POC): NEGATIVE
Glucose, Urine, POC: NEGATIVE
Ketones (UA POC): NEGATIVE
Ketones, Urine, POC: NEGATIVE
Nitrite, Urine, POC: NEGATIVE
Nitrites (UA POC): NEGATIVE
Specific Gravity, Urine, POC: 1.015 NA (ref 1.001–1.035)
Specific gravity (UA POC): 1.015 (ref 1.001–1.035)
Urobilinogen (UA POC): 0.2 (ref 0.2–1)
Urobilinogen, POC: 0.2 (ref 0.2–1)
pH (UA POC): 6.5 (ref 4.6–8.0)
pH, Urine, POC: 6.5 NA (ref 4.6–8.0)

## 2019-03-28 MED ORDER — LEVOFLOXACIN 250 MG TAB
250 mg | ORAL_TABLET | Freq: Every day | ORAL | 0 refills | Status: DC
Start: 2019-03-28 — End: 2019-03-28

## 2019-03-28 MED ORDER — HYDROCORTISONE 1 % TOPICAL CREAM
1 % | Freq: Two times a day (BID) | CUTANEOUS | 0 refills | Status: AC
Start: 2019-03-28 — End: ?

## 2019-03-28 MED ORDER — LEVOFLOXACIN 500 MG TAB
500 mg | ORAL_TABLET | Freq: Every day | ORAL | 0 refills | Status: AC
Start: 2019-03-28 — End: 2019-04-04

## 2019-03-28 MED ORDER — HYDROCORTISONE 1 % TOPICAL CREAM
1 % | Freq: Two times a day (BID) | CUTANEOUS | 0 refills | Status: DC
Start: 2019-03-28 — End: 2019-03-28

## 2019-03-28 MED ORDER — METRONIDAZOLE 500 MG TAB
500 mg | ORAL_TABLET | Freq: Two times a day (BID) | ORAL | 0 refills | Status: DC
Start: 2019-03-28 — End: 2019-03-28

## 2019-03-28 NOTE — Progress Notes (Signed)
Please let him know that he does have an infection and the antibiotic that I sent him home with should clear it up. Thank you!

## 2019-03-28 NOTE — Progress Notes (Signed)
 Donald Blankenship is a 84 y.o. male is here for a catheter change per Seferino Cumins, NP's order.    Seferino Cumins, NP was present in the clinic as incident to.     Visit Vitals  Resp 13   Ht 6' 1 (1.854 m)   Wt 178 lb (80.7 kg)   BMI 23.48 kg/m        Procedure:  The balloon was deflated. Approximately 10ccs of fluid was removed.  The catheter was removed without difficulty.  The genital and perineal areas were cleansed with antiseptic solution.  With clean gloves, I applied sterile lubricant liberally to the catheter tip, lubricating at least six inches of the catheter. Antiseptic solution was used on the cotton balls.  The entire glans was cleansed again.   The penis was held at 90-degree angle. The new indwelling catheter was advanced into the meatus.  The sphincter was allowed to relax. The penis was lowered and the catheter was advanced.  Balloon was inflated using 10cc of sterile water. Catheter was connected to drainage system and secured.  Follow up appointment was made.      Catheter size:  54F  Type:  Coude  Material:  Latex    Urine was:   Yellow and cloudy     Specimen was obtained:  Yes  Drainage bag:   Bedside drainage and leg bag    Observe urethral erosion with yellow and bloody discharge. Due to nitrate positive UA and discharge, rx Levaquin 500mg  x7 given.    Patient did not bring their own catheter supplies.    Encounter Diagnoses   Name Primary?   . Urinary retention Yes   . Gross hematuria        Orders Placed This Encounter   . Foley Insert /Change Simple   . URINE CULTURE,COMPREHENSIVE   . AMB POC URINALYSIS DIP STICK AUTO W/O MICRO   . J5642 - PR BEDSIDE DRAINAGE BAG   . J5641 - PR URINARY LEG OR ABDOMEN BAG   . DISCONTD: amiodarone (CORDARONE) 200 mg tablet   . Eliquis 2.5 mg tablet     Sig: take 1 tablet by mouth twice a day   . carvediloL (COREG) 3.125 mg tablet     Sig: Take 3.125 mg by mouth two (2) times daily (with meals).   . finasteride (PROSCAR) 5 mg tablet     Sig: Take 5 mg by mouth daily.    . furosemide (LASIX) 40 mg tablet     Sig: Take 40 mg by mouth.   . losartan (COZAAR) 25 mg tablet     Sig: Take 25 mg by mouth nightly.   . magnesium oxide (MAG-OX) 400 mg tablet     Sig: take 1 tablet by mouth once daily   . DISCONTD: therapeutic multivitamin-minerals (THERAGRAN-M) tablet     Sig: Take 1 Tab by mouth daily.   . tamsulosin (FLOMAX) 0.4 mg capsule     Sig: Take 0.4 mg by mouth.   . trospium (SANCTURA) 20 mg tablet     Sig: Take 20 mg by mouth daily.   SABRA DISCONTD: metroNIDAZOLE (FLAGYL) 500 mg tablet     Sig: Take 1 Tab by mouth two (2) times a day for 7 days. Indications: balanitis     Dispense:  14 Tab     Refill:  0   . DISCONTD: hydrocortisone (CORTAID) 1 % topical cream     Sig: Apply  to affected area two (2) times a day.  use thin layer     Dispense:  30 g     Refill:  0   . DISCONTD: levoFLOXacin (Levaquin) 250 mg tablet     Sig: Take 2 Tabs by mouth daily for 7 days.     Dispense:  14 Tab     Refill:  0   . levoFLOXacin (LEVAQUIN) 500 mg tablet     Sig: Take 1 Tab by mouth daily for 7 days.     Dispense:  7 Tab     Refill:  0   . hydrocortisone (CORTAID) 1 % topical cream     Sig: Apply  to affected area two (2) times a day. use thin layer     Dispense:  30 g     Refill:  0       Maurilio Sharps, CCMA

## 2019-03-28 NOTE — Progress Notes (Signed)
Progress  Notes by Danella Penton, NP at 03/28/19 1115                Author: Danella Penton, NP  Service: --  Author Type: Nurse Practitioner       Filed: 04/20/19 1306  Encounter Date: 03/28/2019  Status: Signed          Editor: Danella Penton, NP (Nurse Practitioner)                                        ICD-10-CM  ICD-9-CM             1.  Urinary retention   R33.9  788.20  INSERT,TEMP INDWELLING BLAD CATH,SIMPLE                PR BEDSIDE DRAINAGE BAG           PR URINARY LEG OR ABDOMEN BAG           URINE CULTURE,COMPREHENSIVE           AMB POC URINALYSIS DIP STICK AUTO W/O MICRO           REFERRAL TO URODYNAMICS ([facility])           CULTURE, URINE           2.  Gross hematuria   R31.0  599.71  INSERT,TEMP INDWELLING BLAD CATH,SIMPLE                PR BEDSIDE DRAINAGE BAG           PR URINARY LEG OR ABDOMEN BAG           URINE CULTURE,COMPREHENSIVE           AMB POC URINALYSIS DIP STICK AUTO W/O MICRO           CULTURE, URINE           3.  Balanitis   N48.1  607.1              ASSESSMENT:       Balanitis    -inflammation, edema and odor of the glans    -Purulent, foul smelling Discharge present surrounding the foley cath at the urinary meatus   Urinary Retention    -Enlarged Prostate as well as multiple additional comorbidities    -Lost to follow up for BPH 2 years prior      PLAN:    -Levaquin 500 mg po daily x 7 days   -Hydrocortisone cream, apply topically BID   -Change foley and send UA for culture    -Hold Eliquis for 2-3 days if gross hematuria noted   -RTO for Urodynamics.    -Keep foley in until then    -Change foley monthly, flush 1-2x per week    -Additional Supplies given   -Foley care education with patient and daughter per Tech.            Chief Complaint       Patient presents with        ?  Urinary Retention             Pt presents post hospitalization from 10/31-11/20. Foley catheter placed 11/18, has not been changed. Home health staff said there his penis appears  red and has pus-like  discharge. Pt states catheter is pulling against him and causing pain. Pt currently taking Proscar 5mg  1x  daily, Flomax 0.4mg  1x daily, Trospium 20mg  1x daily.        ?  Benign Prostatic Hypertrophy             CT urogram 11/6 showed enlarged prostate with mass effect on base of bladder. Pt has not had any uro visits in last 2 years.           HISTORY OF PRESENT ILLNESS:  Donald Blankenship  is a 84 y.o. male who is seen  in consultation for urinary retention and intermit hematuria. Patient also complains of itching and pain to the tip of his penis. His daughter is present with him and she states that he was told years  ago that he had BPH but has not been seen in about 2 years. She reports that he was "doing fine" until this recent hospitalization when he was unable to void. She states that the foley has been in since early November and [facility]  [facility] the pus like drainage that was noted. She also reports that his urine will occasionally turn bloody, but she has noticed when she holds his blood thinner for 1-2 days it clears and is "fine again for awhile".         Past Medical History:        Diagnosis  Date         ?  CHF (congestive heart failure) (HCC)               Past Surgical History:         Procedure  Laterality  Date          ?  HX APPENDECTOMY         ?  HX HERNIA REPAIR              Hiatal hernia repair multiple times          ?  HX UROLOGICAL              Prostate laser enucleation             Social History          Tobacco Use         ?  Smoking status:  Former Smoker              Years:  20.00         ?  Smokeless tobacco:  Never Used       Substance Use Topics         ?  Alcohol use:  Not Currently         ?  Drug use:  Not on file           No Known Allergies      No family history on file.   Family history of prostate cancer ? - NO        Current Outpatient Medications          Medication  Sig  Dispense  Refill           ?  Eliquis 2.5 mg tablet  take 1 tablet by mouth twice a day          ?  carvediloL (COREG) 3.125 mg tablet  Take 3.125 mg by mouth two (2) times daily (with meals).         ?  finasteride (PROSCAR) 5 mg tablet  Take 5 mg by mouth daily.         ?  furosemide (LASIX) 40 mg tablet  Take 40 mg by mouth.         ?  losartan (COZAAR) 25 mg tablet  Take 25 mg by mouth nightly.         ?  magnesium oxide (MAG-OX) 400 mg tablet  take 1 tablet by mouth once daily         ?  tamsulosin (FLOMAX) 0.4 mg capsule  Take 0.4 mg by mouth.         ?  trospium (SANCTURA) 20 mg tablet  Take 20 mg by mouth daily.               ?  hydrocortisone (CORTAID) 1 % topical cream  Apply  to affected area two (2) times a day. use thin layer  30 g  0           Review of Systems:    Constitutional: Fever: No   Skin: Rash: No   HEENT: Hearing difficulty: Yes   Eyes: Blurred vision: No   Cardiovascular: Chest pain: No   Respiratory: Shortness of breath: No   Gastrointestinal: Nausea/vomiting: No   Musculoskeletal: Back pain: No   Neurological: Weakness: Yes   Psychological: Memory loss: No   Comments/additional findings:       PHYSICAL EXAMINATION:       Visit Vitals      Resp  13     Ht   (1.854 m)     Wt  178 lb (80.7 kg)        BMI  23.48 kg/m??        Constitutional: Well developed, well-nourished male in no acute distress.    CV:  No peripheral swelling noted   Respiratory: No respiratory distress or difficulties   Abdomen:  Soft and nontender.    GU Male:                  CVA tenderness: NO   SCROTUM:  No scrotal rash or lesions noticed.  Normal bilateral testes and epididymis.    PENIS:-inflammation, edema and odor of the glans    -Purulent, foul smelling Discharge present surrounding the foley cath at the urinary meatus    Skin:  Visible skin is Normal color. No evidence of jaundice.      Neuro: Alert and oriented. Slow response.    Psych:  Patient with appropriate affect.       MS: FROM extremities      AUA Assessment Score:   No flowsheet data found.      REVIEW OF LABS AND IMAGING:           Results for orders placed or performed in visit on 03/28/19     URINE CULTURE,COMPREHENSIVE          Specimen: Urine-clean catch         Result  Value  Ref Range            Urine Culture,Comprehensive  (A)               Enterococcus faecalis   Greater than 100,000 colony forming units per mL               Urine Culture,Comprehensive                 Mixed urogenital flora   50,000-100,000 colony forming units per mL              Susceptibility  Enterococcus faecalis -  (no method available)*                Ciprofloxacin ($)  S  Susceptible  ug/mL         Levofloxacin ($)  S  Susceptible  ug/mL         Nitrofurantoin  S  Susceptible  ug/mL         Penicillin  R  Resistant  ug/mL         Tetracycline  R  Resistant  ug/mL         Vancomycin ($)  R  Resistant  ug/mL           * Performed at:  [address] - [place], [place], [place]  [id number] Lab Director: Rush Farmer MD, Phone:  [phone]       AMB POC URINALYSIS DIP STICK AUTO W/O MICRO         Result  Value  Ref Range            Color (UA POC)  Yellow         Clarity (UA POC)  Cloudy         Glucose (UA POC)  Negative  Negative       Bilirubin (UA POC)  1+  Negative       Ketones (UA POC)  Negative  Negative       Specific gravity (UA POC)  1.015  1.001 - 1.035       Blood (UA POC)  2+  Negative       pH (UA POC)  6.5  4.6 - 8.0       Protein (UA POC)  1+  Negative       Urobilinogen (UA POC)  0.2 mg/dL  0.2 - 1       Nitrites (UA POC)  Negative  Negative            Leukocyte esterase (UA POC)  3+  Negative           Any elements of the PMH, FH, SHx, ROS, or preliminary elements of the HPI that were entered by a medical assistant have been reviewed in full.      A copy of today's office visit with all pertinent imaging results and labs were sent to the referring physician.           Whitney Post, NP

## 2019-03-28 NOTE — Progress Notes (Signed)
Please let him know that he does have an infection and the antibiotic that I sent him home with should clear it up. Thank you!

## 2019-03-28 NOTE — Progress Notes (Signed)
ICD-10-CM ICD-9-CM    1. Urinary retention  R33.9 788.20 INSERT,TEMP INDWELLING BLAD CATH,SIMPLE      PR BEDSIDE DRAINAGE BAG      PR URINARY LEG OR ABDOMEN BAG      URINE CULTURE,COMPREHENSIVE      AMB POC URINALYSIS DIP STICK AUTO W/O MICRO      REFERRAL TO URODYNAMICS ([facility])      CULTURE, URINE   2. Gross hematuria  R31.0 599.71 INSERT,TEMP INDWELLING BLAD CATH,SIMPLE      PR BEDSIDE DRAINAGE BAG      PR URINARY LEG OR ABDOMEN BAG      URINE CULTURE,COMPREHENSIVE      AMB POC URINALYSIS DIP STICK AUTO W/O MICRO      CULTURE, URINE   3. Balanitis  N48.1 607.1         ASSESSMENT:     Balanitis   -inflammation, edema and odor of the glans   -Purulent, foul smelling Discharge present surrounding the foley cath at the urinary meatus  Urinary Retention   -Enlarged Prostate as well as multiple additional comorbidities   -Lost to follow up for BPH 2 years prior    PLAN:   -Levaquin 500 mg po daily x 7 days  -Hydrocortisone cream, apply topically BID  -Change foley and send UA for culture   -Hold Eliquis for 2-3 days if gross hematuria noted  -RTO for Urodynamics.   -Keep foley in until then   -Change foley monthly, flush 1-2x per week   -Additional Supplies given  -Foley care education with patient and daughter per Tech.       Chief Complaint   Patient presents with   ??? Urinary Retention     Pt presents post hospitalization from 10/31-11/20. Foley catheter placed 11/18, has not been changed. Home health staff said there his penis appears red and has pus-like discharge. Pt states catheter is pulling against him and causing pain. Pt currently taking Proscar 5mg  1x daily, Flomax 0.4mg  1x daily, Trospium 20mg  1x daily.   ??? Benign Prostatic Hypertrophy     CT urogram 11/6 showed enlarged prostate with mass effect on base of bladder. Pt has not had any uro visits in last 2 years.        HISTORY OF PRESENT ILLNESS:  Townsend Cudworth is a 84 y.o. male who is seen in consultation for urinary retention and intermit hematuria. Patient also complains of itching and pain to the tip of his penis. His daughter is present with him and she states that he was told years ago that he had BPH but has not been seen in about 2 years. She reports that he was "doing fine" until this recent hospitalization when he was unable to void. She states that the foley has been in since early November and that his [facility] nurse was concerned about the pus like drainage that was noted. She also reports that his urine will occasionally turn bloody, but she has noticed when she holds his blood thinner for 1-2 days it clears and is "fine again for awhile".     Past Medical History:   Diagnosis Date   ??? CHF (congestive heart failure) ([place])        Past Surgical History:   Procedure Laterality Date   ??? HX APPENDECTOMY     ??? HX HERNIA REPAIR      Hiatal hernia repair multiple times   ??? HX UROLOGICAL      Prostate laser enucleation  Social History     Tobacco Use   ??? Smoking status: Former Smoker     Years: 20.00   ??? Smokeless tobacco: Never Used   Substance Use Topics   ??? Alcohol use: Not Currently   ??? Drug use: Not on file       No Known Allergies    No family history on file.  Family history of prostate cancer ? - NO    Current Outpatient Medications   Medication Sig Dispense Refill   ??? Eliquis 2.5 mg tablet take 1 tablet by mouth twice a day     ??? carvediloL (COREG) 3.125 mg tablet Take 3.125 mg by mouth two (2) times daily (with meals).     ??? finasteride (PROSCAR) 5 mg tablet Take 5 mg by mouth daily.     ??? furosemide (LASIX) 40 mg tablet Take 40 mg by mouth.     ??? losartan (COZAAR) 25 mg tablet Take 25 mg by mouth nightly.     ??? magnesium oxide (MAG-OX) 400 mg tablet take 1 tablet by mouth once daily     ??? tamsulosin (FLOMAX) 0.4 mg capsule Take 0.4 mg by mouth.     ??? trospium (SANCTURA) 20 mg tablet Take 20 mg by mouth daily.      ??? hydrocortisone (CORTAID) 1 % topical cream Apply  to affected area two (2) times a day. use thin layer 30 g 0       Review of Systems:   Constitutional: Fever: No  Skin: Rash: No  HEENT: Hearing difficulty: Yes  Eyes: Blurred vision: No  Cardiovascular: Chest pain: No  Respiratory: Shortness of breath: No  Gastrointestinal: Nausea/vomiting: No  Musculoskeletal: Back pain: No  Neurological: Weakness: Yes  Psychological: Memory loss: No  Comments/additional findings:     PHYSICAL EXAMINATION:     Visit Vitals  Resp 13   Ht 6\' 1"  (1.854 m)   Wt 178 lb (80.7 kg)   BMI 23.48 kg/m??     Constitutional: Well developed, well-nourished male in no acute distress.   CV:  No peripheral swelling noted  Respiratory: No respiratory distress or difficulties  Abdomen:  Soft and nontender.   GU Male:                 CVA tenderness: NO  SCROTUM:  No scrotal rash or lesions noticed.  Normal bilateral testes and epididymis.   PENIS:-inflammation, edema and odor of the glans   -Purulent, foul smelling Discharge present surrounding the foley cath at the urinary meatus   Skin:  Visible skin is Normal color. No evidence of jaundice.     Neuro: Alert and oriented. Slow response.   Psych:  Patient with appropriate affect.      MS: FROM extremities    AUA Assessment Score:  No flowsheet data found.    REVIEW OF LABS AND IMAGING:      Results for orders placed or performed in visit on 03/28/19   URINE CULTURE,COMPREHENSIVE    Specimen: Urine-clean catch   Result Value Ref Range    Urine Culture,Comprehensive (A)      Enterococcus faecalis  Greater than 100,000 colony forming units per mL      Urine Culture,Comprehensive       Mixed urogenital flora  50,000-100,000 colony forming units per mL         Susceptibility    Enterococcus faecalis -  (no method available)*     Ciprofloxacin ($) S Susceptible ug/mL  Levofloxacin ($) S Susceptible ug/mL     Nitrofurantoin S Susceptible ug/mL     Penicillin R Resistant ug/mL      Tetracycline R Resistant ug/mL     Vancomycin ($) R Resistant ug/mL     * Performed at:  [address] - [facility] [address], [place], [place]  [id number] Lab Director: Jolene SchimkeSanjai Nagendra MD, Phone:  [phone]   AMB POC URINALYSIS DIP STICK AUTO W/O MICRO   Result Value Ref Range    Color (UA POC) Yellow     Clarity (UA POC) Cloudy     Glucose (UA POC) Negative Negative    Bilirubin (UA POC) 1+ Negative    Ketones (UA POC) Negative Negative    Specific gravity (UA POC) 1.015 1.001 - 1.035    Blood (UA POC) 2+ Negative    pH (UA POC) 6.5 4.6 - 8.0    Protein (UA POC) 1+ Negative    Urobilinogen (UA POC) 0.2 mg/dL 0.2 - 1    Nitrites (UA POC) Negative Negative    Leukocyte esterase (UA POC) 3+ Negative       Any elements of the PMH, FH, SHx, ROS, or preliminary elements of the HPI that were entered by a medical assistant have been reviewed in full.    A copy of today's office visit with all pertinent imaging results and labs were sent to the referring physician.        Danella PentonArica L Tashai Catino, NP

## 2019-03-28 NOTE — Progress Notes (Signed)
Donald Blankenship is a 84 y.o. male is here for a catheter change per Reather Laurence, NP's order.    Reather Laurence, NP was present in the clinic as incident to.     Visit Vitals  Resp 13   Ht 6\' 1"  (1.854 m)   Wt 178 lb (80.7 kg)   BMI 23.48 kg/m??        Procedure:  The balloon was deflated. Approximately 10ccs of fluid was removed.  The catheter was removed without difficulty.  The genital and perineal areas were cleansed with antiseptic solution.  With clean gloves, I applied sterile lubricant liberally to the catheter tip, lubricating at least six inches of the catheter. Antiseptic solution was used on the cotton balls.  The entire glans was cleansed again.   The penis was held at 90-degree angle. The new indwelling catheter was advanced into the meatus.  The sphincter was allowed to relax. The penis was lowered and the catheter was advanced.  Balloon was inflated using 10cc of sterile water. Catheter was connected to drainage system and secured.  Follow up appointment was made.      Catheter size:  57F  Type:  Coude  Material:  Latex    Urine was:   Yellow and cloudy     Specimen was obtained:  Yes  Drainage bag:   Bedside drainage and leg bag    Observe urethral erosion with yellow and bloody discharge. Due to nitrate positive UA and discharge, rx Levaquin 500mg  x7 given.    Patient did not bring their own catheter supplies.    Encounter Diagnoses   Name Primary?   ??? Urinary retention Yes   ??? Gross hematuria        Orders Placed This Encounter   ??? Foley Insert /Change Simple   ??? URINE CULTURE,COMPREHENSIVE   ??? AMB POC URINALYSIS DIP STICK AUTO W/O MICRO   ??? A4357 - PR BEDSIDE DRAINAGE BAG   ??? A4358 - PR URINARY LEG OR ABDOMEN BAG   ??? DISCONTD: amiodarone (CORDARONE) 200 mg tablet   ??? Eliquis 2.5 mg tablet     Sig: take 1 tablet by mouth twice a day   ??? carvediloL (COREG) 3.125 mg tablet     Sig: Take 3.125 mg by mouth two (2) times daily (with meals).   ??? finasteride (PROSCAR) 5 mg tablet      Sig: Take 5 mg by mouth daily.   ??? furosemide (LASIX) 40 mg tablet     Sig: Take 40 mg by mouth.   ??? losartan (COZAAR) 25 mg tablet     Sig: Take 25 mg by mouth nightly.   ??? magnesium oxide (MAG-OX) 400 mg tablet     Sig: take 1 tablet by mouth once daily   ??? DISCONTD: therapeutic multivitamin-minerals (THERAGRAN-M) tablet     Sig: Take 1 Tab by mouth daily.   ??? tamsulosin (FLOMAX) 0.4 mg capsule     Sig: Take 0.4 mg by mouth.   ??? trospium (SANCTURA) 20 mg tablet     Sig: Take 20 mg by mouth daily.   ??? DISCONTD: metroNIDAZOLE (FLAGYL) 500 mg tablet     Sig: Take 1 Tab by mouth two (2) times a day for 7 days. Indications: balanitis     Dispense:  14 Tab     Refill:  0   ??? DISCONTD: hydrocortisone (CORTAID) 1 % topical cream     Sig: Apply  to affected area two (2) times a day.  use thin layer     Dispense:  30 g     Refill:  0   ??? DISCONTD: levoFLOXacin (Levaquin) 250 mg tablet     Sig: Take 2 Tabs by mouth daily for 7 days.     Dispense:  14 Tab     Refill:  0   ??? levoFLOXacin (LEVAQUIN) 500 mg tablet     Sig: Take 1 Tab by mouth daily for 7 days.     Dispense:  7 Tab     Refill:  0   ??? hydrocortisone (CORTAID) 1 % topical cream     Sig: Apply  to affected area two (2) times a day. use thin layer     Dispense:  30 g     Refill:  0       Verdene Rio, CCMA

## 2019-04-03 LAB — URINE CULTURE,COMPREHENSIVE

## 2019-04-03 NOTE — Telephone Encounter (Addendum)
Called pt and spoke with pt daughter Jaye Beagle and informed.        ---- Message from Danella Penton, NP sent at 04/03/2019  1:02 PM EST -----  Please let him know that he does have an infection and the antibiotic that I sent him home with should clear it up. Thank you!

## 2019-04-11 ENCOUNTER — Encounter

## 2019-04-16 ENCOUNTER — Encounter
Payer: PRIVATE HEALTH INSURANCE | Attending: Student in an Organized Health Care Education/Training Program | Primary: Internal Medicine

## 2019-04-21 ENCOUNTER — Ambulatory Visit: Attending: Nurse Practitioner | Primary: Internal Medicine

## 2019-04-21 ENCOUNTER — Ambulatory Visit: Primary: Internal Medicine

## 2019-04-21 ENCOUNTER — Ambulatory Visit: Admit: 2019-04-21 | Discharge: 2019-04-21 | Attending: Nurse Practitioner | Primary: Internal Medicine

## 2019-04-21 ENCOUNTER — Encounter: Payer: PRIVATE HEALTH INSURANCE | Primary: Internal Medicine

## 2019-04-21 ENCOUNTER — Encounter: Payer: PRIVATE HEALTH INSURANCE | Attending: Nurse Practitioner | Primary: Internal Medicine

## 2019-04-21 ENCOUNTER — Ambulatory Visit: Admit: 2019-04-21 | Discharge: 2019-04-23 | Primary: Internal Medicine

## 2019-04-21 DIAGNOSIS — N401 Enlarged prostate with lower urinary tract symptoms: Secondary | ICD-10-CM

## 2019-04-21 MED ORDER — TRIMETHOPRIM-SULFAMETHOXAZOLE 160 MG-800 MG TAB
160-800 mg | ORAL_TABLET | ORAL | 0 refills | Status: DC
Start: 2019-04-21 — End: 2019-04-21

## 2019-04-21 NOTE — Progress Notes (Signed)
 Triage note universal pause    Universal Procedure Pause:    1. Correct patient confirmed:  yes    2. Allergies confirmed:  no    3. Patient taking antiplatelet medications:  no    4. Patient taking anticoagulants:  yes    5. Correct procedure and side confirmed and consent signed:  yes    6. Correct antibiotics confirmed:  yes        [facility]  Urodynamics  Without Video- Fluoroscopy    Name: Donald Blankenship   Date: 04/21/2019      Donald Blankenship is a 85 y.o. male who presents today for evaluation. Patient has been referred by Aricia Gogol, NP.  Dr. Virasoro is available in clinic as the incident to provider.      Upon arrival, the patient's foley catheter was removed by deflating 10 cc balloon and gently removing 18 fr coude foley catheter.        The genital and perineal areas were cleansed with benzalkonium chloride and patient was catheterized with a 14 fr Coude catheter to assess PVR and absence of urinary tract infection.     A #7 fr Coude urodynamics catheter was inserted into the bladder to infuse fluid and assess bladder activity and voiding parameters.  A #9 fr catheter was inserted into the rectum to assess straining during voiding. Both catheters were connected to water filled lines to transducers that simultaneously measure pressure generated inside and outside the bladder during filling and voiding as well as resulting flow volume and time.      Three patch electrodes are placed, one ground and two perianally which are used to measure the electrical activity of the external urinary sphincter during filling and emptying the bladder.      Infusion with sterile water was started and the patient was asked to voice sensations of pressure, urge and bladder fullness, followed by instructions to void.         Results for orders placed or performed in visit on 04/21/19   AMB POC URINALYSIS DIP STICK AUTO W/O MICRO   Result Value Ref Range    Color (UA POC) Yellow     Clarity (UA POC) Clear     Glucose (UA  POC) Negative Negative    Bilirubin (UA POC) Negative Negative    Ketones (UA POC) Negative Negative    Specific gravity (UA POC) 1.025 1.001 - 1.035    Blood (UA POC) 1+ Negative    pH (UA POC) 5.5 4.6 - 8.0    Protein (UA POC) Negative Negative    Urobilinogen (UA POC) 0.2 mg/dL 0.2 - 1    Nitrites (UA POC) Negative Negative    Leukocyte esterase (UA POC) 1+ Negative        Orders Placed This Encounter   . INSERT,TEMP INDWELLING BLAD CATH,SIMPLE   . AMB POC URINALYSIS DIP STICK AUTO W/O MICRO   . ([id number]) - PR COMPLEX CYSTOMETROGRAM   . ([id number]) - PR ANAL/URINARY MUSCLE STUDY   . DISCONTD: trimethoprim-sulfamethoxazole (BACTRIM DS, SEPTRA DS) 160-800 mg per tablet     Sig: Take 1 Tab by mouth now for 1 dose.     Dispense:  1 Tab     Refill:  0         ICD-10-CM ICD-9-CM    1. Benign prostatic hyperplasia with urinary retention  N40.1 600.01 AMB POC URINALYSIS DIP STICK AUTO W/O MICRO    R33.8 788.20 PR COMPLEX CYSTOMETROGRAM  PR ANAL/URINARY MUSCLE STUDY       Past Medical History:   Diagnosis Date   . CHF (congestive heart failure) (HCC)       Past Surgical History:   Procedure Laterality Date   . HX APPENDECTOMY     . HX HERNIA REPAIR      Hiatal hernia repair multiple times   . HX UROLOGICAL      Prostate laser enucleation     Voiding Diary: Patient has foley     Uroflometry  Comments: 55 ml clear yellow urine obtained      Cystometrogram  Sensation NONE    First sensation None verbalized    Strong desire None verbalized   Capacity None verbalized   Compliance: decreased Pressure: 23cm Volume: 250 ml   Detrusor overactivity:no     Leakage: Yes: Pressure 30 cm     Volume: 308 ml w/o awareness      Micturition  Post Test Residual: 375 ml obtained via foley catheter    Comments: Patient was given Bactrim DS PO per UDS protocol and confirmed he is taking Flomax 0.4 mg, trospium 20 mg and Proscar 5 mg daily.  Per [facility] patient has an averge capacity, decreased compliant bladder with NO sensations and no DO.   He attempted to void with a maximum detrusor pressure reaching 42 cmH2O with no flow.       The Patient Instruction Sheet was reviewed and Patient Instruction Sheet was understood.    Daphne CHRISTELLA Reasoner         Brief history (present illness, neurological, S/S): Other neurologic BPH   Relevant surgical history: Not Applicable    PE: Deferred    Cystoscopy: No      I have reviewed the above study and tracings and agree with above.  Pearson Spiller, MD

## 2019-04-21 NOTE — Progress Notes (Signed)
Progress  Notes by Danella Penton, NP at 04/21/19 0900                Author: Danella Penton, NP  Service: --  Author Type: Nurse Practitioner       Filed: 04/21/19 1005  Encounter Date: 04/21/2019  Status: Signed          Editor: Danella Penton, NP (Nurse Practitioner)                                 ASSESSMENT:    Urinary Retention   -Foley placed while hospitalized due to retention    -UDS completed.  Pt unable to void.       BPH   -History of BPH   -Currently on Proscar, flomax and santura.    -UDS results reviewed.       PLAN:        1. Urinary Retention, foley catheter placed on 02/2019     Foley changed 03/27/19 and Today, 04/21/19    Maintain foley cath at this time    Continue cath care and changes monthly.    Continue  Flomax 0.4 mg, Proscar 5 mg and Sanctura 20 mg    Follow up for BPH work up             2. UTI     Recent UTI treated with Levaquin       3. Balanitis     Treated with hydrocortisone cream and Levaquin       Body mass index is 23.48 kg/m??.      DISCUSSION:          Chief Complaint       Patient presents with        ?  Urinary Retention             Pt is here to review UDS results and discuss tx options         HISTORY OF PRESENT ILLNESS:  Donald Blankenship  is a 85 y.o. male who presents  today for urinary retention, balanitis, UTI.   Patient was admitted to the hospital in 02/2019 for cardiac issues. He was noted to be in retention during this admission and was discharged with foley catheter in place. He is currently on Flomax 0.4 mg, Proscar 5 mg and Sanctura 20 mg . He was seen  in the office on 03/28/2019 and at that time was advised to have UDS and return to discuss. Patient is seen today with daughter present. She states that they were aware that his prostate was enlarged, and that he had gone through this before about 7 years  ago when he had a laser procedure for a similar issue. We discussed maintaining foley cath until BPH work up can be scheduled with surgeon to discuss options.  Patient is agreeable to this plan.       REVIEW OF SYSTEMS:       Constitutional: Fever: No   Skin: Rash: No   HEENT: Hearing difficulty: No   Eyes: Blurred vision: No   Cardiovascular: Chest pain: No   Respiratory: Shortness of breath: No   Gastrointestinal: Nausea/vomiting: No   Musculoskeletal: Back pain: No   Neurological: Weakness: No   Psychological: Memory loss: No   Comments/additional findings: None         Past Medical History:        Diagnosis  Date         ?  CHF (congestive heart failure) ([place])            Past Surgical History:         Procedure  Laterality  Date          ?  HX APPENDECTOMY         ?  HX HERNIA REPAIR              Hiatal hernia repair multiple times          ?  HX UROLOGICAL              Prostate laser enucleation        No family history on file.     Social History          Tobacco Use         ?  Smoking status:  Former Smoker              Years:  20.00         ?  Smokeless tobacco:  Never Used       Substance Use Topics         ?  Alcohol use:  Not Currently         ?  Drug use:  Not on file        No Known Allergies     Current Outpatient Medications          Medication  Sig  Dispense  Refill           ?  carvediloL (COREG) 6.25 mg tablet  take 1 tablet by mouth twice a day         ?  Eliquis 2.5 mg tablet  take 1 tablet by mouth twice a day         ?  finasteride (PROSCAR) 5 mg tablet  Take 5 mg by mouth daily.         ?  furosemide (LASIX) 40 mg tablet  Take 40 mg by mouth.         ?  losartan (COZAAR) 25 mg tablet  Take 25 mg by mouth nightly.         ?  magnesium oxide (MAG-OX) 400 mg tablet  take 1 tablet by mouth once daily         ?  tamsulosin (FLOMAX) 0.4 mg capsule  Take 0.4 mg by mouth.         ?  trospium (SANCTURA) 20 mg tablet  Take 20 mg by mouth daily.               ?  hydrocortisone (CORTAID) 1 % topical cream  Apply  to affected area two (2) times a day. use thin layer  30 g  0           Any elements of the PMH, FH, SHx, ROS, or preliminary elements of the HPI that  were entered by a medical assistant have been reviewed in full.       PHYSICAL EXAMINATION:       Visit Vitals      Resp  12     Ht  6\' 1"  (1.854 m)     Wt  178 lb (80.7 kg)        BMI  23.48 kg/m??        Constitutional: Well developed, well-nourished male in no acute distress.    CV:  No peripheral swelling noted   Respiratory: No respiratory distress or difficulties  Abdomen:  Soft and nontender. No masses.     GU male: 62 fr coude in place draining pink tinged urine.    Skin: No bruising or rashes.  No petechia.     Neuro: Alert and oriented.     Psych:  Patient with appropriate affect.     REVIEW OF LABS AND IMAGING:          Results for orders placed or performed in visit on 03/28/19     URINE CULTURE,COMPREHENSIVE          Specimen: Urine-clean catch         Result  Value  Ref Range            Urine Culture,Comprehensive  (A)               Enterococcus faecalis   Greater than 100,000 colony forming units per mL               Urine Culture,Comprehensive                 Mixed urogenital flora   50,000-100,000 colony forming units per mL              Susceptibility          Enterococcus faecalis -  (no method available)*                Ciprofloxacin ($)  S  Susceptible  ug/mL         Levofloxacin ($)  S  Susceptible  ug/mL         Nitrofurantoin  S  Susceptible  ug/mL         Penicillin  R  Resistant  ug/mL         Tetracycline  R  Resistant  ug/mL         Vancomycin ($)  R  Resistant  ug/mL           * Performed at:  [address] - [facility] [address] [place], [place]  [id number] Lab Director: Jolene Schimke MD, Phone:  [phone]       AMB POC URINALYSIS DIP STICK AUTO W/O MICRO         Result  Value  Ref Range            Color (UA POC)  Yellow         Clarity (UA POC)  Cloudy         Glucose (UA POC)  Negative  Negative       Bilirubin (UA POC)  1+  Negative       Ketones (UA POC)  Negative  Negative       Specific gravity (UA POC)  1.015  1.001 - 1.035       Blood (UA POC)  2+  Negative       pH (UA POC)   6.5  4.6 - 8.0       Protein (UA POC)  1+  Negative       Urobilinogen (UA POC)  0.2 mg/dL  0.2 - 1       Nitrites (UA POC)  Negative  Negative            Leukocyte esterase (UA POC)  3+  Negative        Prior labs or imaging:      RUS 03/04/19    FINDINGS:   ??   Right kidney: 10.3 cm length. Cortical atrophy. Normal echotexture. No hydronephrosis. No focal  abnormality.   ??   Left kidney: 10.4 cm  length. Cortical atrophy. Normal echotexture. No hydronephrosis. No focal abnormality. Limited visualization due to location and uncooperativeness.   ??   Bladder: Incompletely distended without obvious focal abnormality.   ??   Prostate:  43 cc estimated volume.   ??   IMPRESSION   No evidence of obstructive uropathy. Renal cortical atrophy noted. Mildly enlarged prostate. Bladder poorly distended.       CTU 02/21/19   IMPRESSION   ??   No urolithiasis, solid renal lesion, or collecting system filling defect to explain hematuria.   ??   Enlarged prostate with mass effect  on the base of the bladder. Nonspecific small hypoattenuating area within the prostate abutting the urethra. Small prostatic abscess or phlegmonous change not excluded.   ??       A copy of today's office visit with all pertinent imaging results and labs were sent to the referring physician.           Danella Penton, NP

## 2019-04-21 NOTE — Progress Notes (Signed)
Triage note universal pause    Universal Procedure Pause:    1. Correct patient confirmed:  yes    2. Allergies confirmed:  no    3. Patient taking antiplatelet medications:  no    4. Patient taking anticoagulants:  yes    5. Correct procedure and side confirmed and consent signed:  yes    6. Correct antibiotics confirmed:  yes        [facility] Urodynamics  Without Video- Fluoroscopy    Name: Donald Blankenship   Date: 04/21/2019      Donald Blankenship is a 85 y.o. male who presents today for evaluation. Patient has been referred by Crista Curb, NP.  Dr. Kizzie Fantasia is available in clinic as the incident to provider.      Upon arrival, the patient's foley catheter was removed by deflating 10 cc balloon and gently removing 18 fr coude foley catheter.        The genital and perineal areas were cleansed with benzalkonium chloride and patient was catheterized with a 14 fr Coude catheter to assess PVR and absence of urinary tract infection.     A #7 fr Coude urodynamics catheter was inserted into the bladder to infuse fluid and assess bladder activity and voiding parameters.  A #9 fr catheter was inserted into the rectum to assess straining during voiding. Both catheters were connected to water filled lines to transducers that simultaneously measure pressure generated inside and outside the bladder during filling and voiding as well as resulting flow volume and time.      Three patch electrodes are placed, one ground and two perianally which are used to measure the electrical activity of the external urinary sphincter during filling and emptying the bladder.      Infusion with sterile water was started and the patient was asked to voice sensations of pressure, urge and bladder fullness, followed by instructions to void.         Results for orders placed or performed in visit on 04/21/19   AMB POC URINALYSIS DIP STICK AUTO W/O MICRO   Result Value Ref Range    Color (UA POC) Yellow     Clarity (UA POC) Clear      Glucose (UA POC) Negative Negative    Bilirubin (UA POC) Negative Negative    Ketones (UA POC) Negative Negative    Specific gravity (UA POC) 1.025 1.001 - 1.035    Blood (UA POC) 1+ Negative    pH (UA POC) 5.5 4.6 - 8.0    Protein (UA POC) Negative Negative    Urobilinogen (UA POC) 0.2 mg/dL 0.2 - 1    Nitrites (UA POC) Negative Negative    Leukocyte esterase (UA POC) 1+ Negative        Orders Placed This Encounter   ??? INSERT,TEMP INDWELLING BLAD CATH,SIMPLE   ??? AMB POC URINALYSIS DIP STICK AUTO W/O MICRO   ??? ([id number]) - PR COMPLEX CYSTOMETROGRAM   ??? ([id number]) - PR ANAL/URINARY MUSCLE STUDY   ??? DISCONTD: trimethoprim-sulfamethoxazole (BACTRIM DS, SEPTRA DS) 160-800 mg per tablet     Sig: Take 1 Tab by mouth now for 1 dose.     Dispense:  1 Tab     Refill:  0         ICD-10-CM ICD-9-CM    1. Benign prostatic hyperplasia with urinary retention  N40.1 600.01 AMB POC URINALYSIS DIP STICK AUTO W/O MICRO    R33.8 788.20 PR COMPLEX CYSTOMETROGRAM  PR ANAL/URINARY MUSCLE STUDY       Past Medical History:   Diagnosis Date   ??? CHF (congestive heart failure) (HCC)       Past Surgical History:   Procedure Laterality Date   ??? HX APPENDECTOMY     ??? HX HERNIA REPAIR      Hiatal hernia repair multiple times   ??? HX UROLOGICAL      Prostate laser enucleation     Voiding Diary: Patient has foley     Uroflometry  Comments: 55 ml clear yellow urine obtained      Cystometrogram  Sensation NONE    First sensation None verbalized    Strong desire None verbalized   Capacity None verbalized   Compliance: decreased Pressure: 23cm Volume: 250 ml   Detrusor overactivity:no     Leakage: Yes: Pressure 30 cm     Volume: 308 ml w/o awareness      Micturition  Post Test Residual: 375 ml obtained via foley catheter     Comments: Patient was given Bactrim DS PO per UDS protocol and confirmed he is taking Flomax 0.4 mg, trospium 20 mg and Proscar 5 mg daily.  Per [facility] patient has an averge capacity, decreased compliant bladder with NO sensations and no DO.  He attempted to void with a maximum detrusor pressure reaching 42 cmH2O with no flow.       The Patient Instruction Sheet was reviewed and Patient Instruction Sheet was understood.    Donald Blankenship         Brief history (present illness, neurological, S/S): Other neurologic BPH   Relevant surgical history: Not Applicable    PE: Deferred    Cystoscopy: No      I have reviewed the above study and tracings and agree with above.  Scot Dock, MD

## 2019-04-21 NOTE — Progress Notes (Signed)
ASSESSMENT:   Urinary Retention  -Foley placed while hospitalized due to retention   -UDS completed.  Pt unable to void.     BPH  -History of BPH  -Currently on Proscar, flomax and santura.   -UDS results reviewed.     PLAN:      1. Urinary Retention, foley catheter placed on 02/2019    Foley changed 03/27/19 and Today, 04/21/19   Maintain foley cath at this time   Continue cath care and changes monthly.   Continue  Flomax 0.4 mg, Proscar 5 mg and Sanctura 20 mg   Follow up for BPH work up          2. UTI    Recent UTI treated with Levaquin     3. Balanitis    Treated with hydrocortisone cream and Levaquin     Body mass index is 23.48 kg/m??.    DISCUSSION:      Chief Complaint   Patient presents with   ??? Urinary Retention     Pt is here to review UDS results and discuss tx options      HISTORY OF PRESENT ILLNESS:  Donald Blankenship is a 85 y.o. male who presents today for urinary retention, balanitis, UTI.  Patient was admitted to the hospital in 02/2019 for cardiac issues. He was noted to be in retention during this admission and was discharged with foley catheter in place. He is currently on Flomax 0.4 mg, Proscar 5 mg and Sanctura 20 mg . He was seen in the office on 03/28/2019 and at that time was advised to have UDS and return to discuss. Patient is seen today with daughter present. She states that they were aware that his prostate was enlarged, and that he had gone through this before about 7 years ago when he had a laser procedure for a similar issue. We discussed maintaining foley cath until BPH work up can be scheduled with surgeon to discuss options. Patient is agreeable to this plan.     REVIEW OF SYSTEMS:     Constitutional: Fever: No  Skin: Rash: No  HEENT: Hearing difficulty: No  Eyes: Blurred vision: No  Cardiovascular: Chest pain: No  Respiratory: Shortness of breath: No  Gastrointestinal: Nausea/vomiting: No  Musculoskeletal: Back pain: No  Neurological: Weakness: No   Psychological: Memory loss: No  Comments/additional findings: None     Past Medical History:   Diagnosis Date   ??? CHF (congestive heart failure) (HCC)      Past Surgical History:   Procedure Laterality Date   ??? HX APPENDECTOMY     ??? HX HERNIA REPAIR      Hiatal hernia repair multiple times   ??? HX UROLOGICAL      Prostate laser enucleation     No family history on file.  Social History     Tobacco Use   ??? Smoking status: Former Smoker     Years: 20.00   ??? Smokeless tobacco: Never Used   Substance Use Topics   ??? Alcohol use: Not Currently   ??? Drug use: Not on file     No Known Allergies  Current Outpatient Medications   Medication Sig Dispense Refill   ??? carvediloL (COREG) 6.25 mg tablet take 1 tablet by mouth twice a day     ??? Eliquis 2.5 mg tablet take 1 tablet by mouth twice a day     ??? finasteride (PROSCAR) 5 mg tablet Take 5 mg by mouth daily.     ???  furosemide (LASIX) 40 mg tablet Take 40 mg by mouth.     ??? losartan (COZAAR) 25 mg tablet Take 25 mg by mouth nightly.     ??? magnesium oxide (MAG-OX) 400 mg tablet take 1 tablet by mouth once daily     ??? tamsulosin (FLOMAX) 0.4 mg capsule Take 0.4 mg by mouth.     ??? trospium (SANCTURA) 20 mg tablet Take 20 mg by mouth daily.     ??? hydrocortisone (CORTAID) 1 % topical cream Apply  to affected area two (2) times a day. use thin layer 30 g 0       Any elements of the PMH, FH, SHx, ROS, or preliminary elements of the HPI that were entered by a medical assistant have been reviewed in full.     PHYSICAL EXAMINATION:     Visit Vitals  Resp 12   Ht 6\' 1"  (1.854 m)   Wt 178 lb (80.7 kg)   BMI 23.48 kg/m??     Constitutional: Well developed, well-nourished male in no acute distress.   CV:  No peripheral swelling noted  Respiratory: No respiratory distress or difficulties  Abdomen:  Soft and nontender. No masses.    GU male: 61 fr coude in place draining pink tinged urine.   Skin: No bruising or rashes.  No petechia.    Neuro: Alert and oriented.     Psych:  Patient with appropriate affect.    REVIEW OF LABS AND IMAGING:      Results for orders placed or performed in visit on 03/28/19   URINE CULTURE,COMPREHENSIVE    Specimen: Urine-clean catch   Result Value Ref Range    Urine Culture,Comprehensive (A)      Enterococcus faecalis  Greater than 100,000 colony forming units per mL      Urine Culture,Comprehensive       Mixed urogenital flora  50,000-100,000 colony forming units per mL         Susceptibility    Enterococcus faecalis -  (no method available)*     Ciprofloxacin ($) S Susceptible ug/mL     Levofloxacin ($) S Susceptible ug/mL     Nitrofurantoin S Susceptible ug/mL     Penicillin R Resistant ug/mL     Tetracycline R Resistant ug/mL     Vancomycin ($) R Resistant ug/mL     * Performed at:  [address] - [facility] [address] [place], [place]  [place] Lab Director: [id number] MD, Phone:  [phone]   AMB POC URINALYSIS DIP STICK AUTO W/O MICRO   Result Value Ref Range    Color (UA POC) Yellow     Clarity (UA POC) Cloudy     Glucose (UA POC) Negative Negative    Bilirubin (UA POC) 1+ Negative    Ketones (UA POC) Negative Negative    Specific gravity (UA POC) 1.015 1.001 - 1.035    Blood (UA POC) 2+ Negative    pH (UA POC) 6.5 4.6 - 8.0    Protein (UA POC) 1+ Negative    Urobilinogen (UA POC) 0.2 mg/dL 0.2 - 1    Nitrites (UA POC) Negative Negative    Leukocyte esterase (UA POC) 3+ Negative     Prior labs or imaging:    RUS 03/04/19   FINDINGS:   ??   Right kidney: 10.3 cm length. Cortical atrophy. Normal echotexture. No hydronephrosis. No focal abnormality.   ??   Left kidney: 10.4 cm length. Cortical atrophy. Normal echotexture. No hydronephrosis. No focal abnormality. Limited  visualization due to location and uncooperativeness.   ??   Bladder: Incompletely distended without obvious focal abnormality.   ??   Prostate: 43 cc estimated volume.   ??   IMPRESSION    No evidence of obstructive uropathy. Renal cortical atrophy noted. Mildly enlarged prostate. Bladder poorly distended.     CTU 02/21/19  IMPRESSION   ??   No urolithiasis, solid renal lesion, or collecting system filling defect to explain hematuria.   ??   Enlarged prostate with mass effect on the base of the bladder. Nonspecific small hypoattenuating area within the prostate abutting the urethra. Small prostatic abscess or phlegmonous change not excluded.   ??     A copy of today's office visit with all pertinent imaging results and labs were sent to the referring physician.        Whitney Post, NP

## 2019-04-24 LAB — AMB POC URINALYSIS DIP STICK AUTO W/O MICRO
Bilirubin (UA POC): NEGATIVE
Bilirubin, Urine, POC: NEGATIVE
Glucose (UA POC): NEGATIVE
Glucose, Urine, POC: NEGATIVE
Ketones (UA POC): NEGATIVE
Ketones, Urine, POC: NEGATIVE
Nitrite, Urine, POC: NEGATIVE
Nitrites (UA POC): NEGATIVE
Protein (UA POC): NEGATIVE
Protein, Urine, POC: NEGATIVE
Specific Gravity, Urine, POC: 1.025 NA (ref 1.001–1.035)
Specific gravity (UA POC): 1.025 (ref 1.001–1.035)
Urobilinogen (UA POC): 0.2 (ref 0.2–1)
Urobilinogen, POC: 0.2 (ref 0.2–1)
pH (UA POC): 5.5 (ref 4.6–8.0)
pH, Urine, POC: 5.5 NA (ref 4.6–8.0)

## 2019-05-05 NOTE — Telephone Encounter (Signed)
Home health nurse calling to speak with nurse about caths.. CBN: [phone]    I advised we are closed and someone will call her back.    Thank you

## 2019-05-06 NOTE — Telephone Encounter (Signed)
Spoke to home care nurse inform patient he needs monthly foley changes 36fr coude last change was in the office 04/21/2019 understanding verbalization

## 2019-05-07 ENCOUNTER — Encounter: Attending: Urology | Primary: Internal Medicine

## 2019-05-21 MED ORDER — MIRABEGRON ER 50 MG TABLET,EXTENDED RELEASE 24 HR
50 mg | ORAL_TABLET | Freq: Every day | ORAL | 1 refills | Status: AC
Start: 2019-05-21 — End: ?

## 2019-05-21 NOTE — Telephone Encounter (Signed)
Patient's HH nurse called to advised that the patient's catheter is leaking badly and also she stated that the patient threaten to pull the catheter out because of this issue. The patient's nurse is requesting a call back as soon as possible to advise on what should be done for the patient. Please call the Donald Blankenship ( nurse) at [phone].

## 2019-05-21 NOTE — Telephone Encounter (Signed)
[facility] called to report that patient's foley is leaking, despite being changed and flushed. HHN is wondering what else can be done, patient is uncomfortable. [place] [phone], [facility].

## 2019-05-21 NOTE — Telephone Encounter (Signed)
Forward to Dr. Charyl Bigger, [facility] is out of the office, inform to send in Myrbetriq 50mg  rx      Call inform patient to stop Trospium sending in new medication Myrbetriq 50mg  to his [facility] pharm, verbalized understanding.

## 2019-07-10 ENCOUNTER — Encounter: Attending: Urology | Primary: Internal Medicine

## 2019-07-11 ENCOUNTER — Encounter: Attending: Urology | Primary: Internal Medicine

## 2019-07-24 ENCOUNTER — Encounter: Attending: Urology | Primary: Internal Medicine

## 2019-08-07 ENCOUNTER — Encounter: Attending: Urology | Primary: Internal Medicine

## 2019-08-19 ENCOUNTER — Ambulatory Visit: Attending: Urology | Primary: Internal Medicine

## 2019-08-19 ENCOUNTER — Ambulatory Visit: Admit: 2019-08-19 | Discharge: 2019-08-19 | Attending: Urology | Primary: Internal Medicine

## 2019-08-19 DIAGNOSIS — R339 Retention of urine, unspecified: Secondary | ICD-10-CM

## 2019-08-19 LAB — AMB POC PVR, MEAS,POST-VOID RES,US,NON-IMAGING
PVR POC: 17 cc
PVR: 17 cc

## 2019-08-19 NOTE — Progress Notes (Signed)
Progress Notes by York Pellant, MD at 08/19/19 1030                Author: York Pellant, MD  Service: --  Author Type: Physician       Filed: 08/22/19 0852  Encounter Date: 08/19/2019  Status: Signed          Editor: York Pellant, MD (Physician)                                    Donald Blankenship   [birth year]                      ICD-10-CM  ICD-9-CM             1.  Urinary retention   R33.9  788.20  CYSTOURETHROSCOPY                AMB POC PVR, MEAS,POST-VOID RES,US,NON-IMAGING           2.  Atonic bladder   N31.2  596.4       3.  Encounter for Foley catheter removal   Z46.6  V53.6       4.  Benign prostatic hyperplasia with urinary retention   N40.1  600.01              R33.8  788.20             Assessment and Plan:   DRE today: 60 grams, anodular       1. Urinary Retention / Atonic Bladder     Foley catheter placed on 02/2019 during admission for A-Fib     UDS 04/21/2019: decreased compliant bladder with NO sensations and no DO.  He attempted to void with  a maximum detrusor pressure reaching 42 cmH2O with no flow    Cystoscopy today: large intravesical median lobe, open bladder neck and prostatic urethra         Foley catheter removed today. Advised pt to RTO if unable to void by 3pm (PVR today: 17cc)    Advised pt to D/C Trospium.      Continue Flomax 0.4 mg, Proscar 5 mg     Will consider Neuromodulation Interstim       2. BPH w LUTS - s/p laser PVP in the past    Cystoscopy today: large intravesical median lobe, open bladder neck and prostatic urethra           RTO in 3 months          All questions during today's visit were answered.            Chief Complaint       Patient presents with        ?  Benign Prostatic Hypertrophy        ?  Urinary Retention              HISTORY OF PRESENT ILLNESS:      Donald Blankenship is a 85 y.o.  male who presents today in consultation for urinary retention and BPH screening. Patient has been having foley in place with monthly catheter changes since foley was  placed during admission to [facility] in  02/2019 for A-fib. Patient reports a prior hx of retention and he is s/p laser PVP in the past with benefit. Patient had UDS done on 04/21/2019 which showed  averge capacity, decreased compliant bladder with NO sensations and  no DO.  He attempted to void  with a maximum detrusor pressure reaching 42 cmH2O with no flow. He is currently on Flomax, Proscar, and Trospium to manage his urinary sx's.        Today, patient is tolerating foley well.    Foley catheter in place today - has maintained foley catheter since 02/2019    Pt denies any bothersome urinary sx's at baseline prior to foley catheter placement   No c/o hesitancy or intermittency at baseline       Asymptomatic for infection today.   Denies any gross hematuria or dysuria.   No N/V/F/C.    No Flank pain or renal colic       PMhx:   A-Fib - s/p Cardioversion, on Eliquis       UDS 04/21/2019   Uroflometry     Comments: 55 ml clear yellow urine obtained      ??   Cystometrogram                          Sensation  NONE            First sensation  None verbalized            Strong desire  None verbalized           Capacity  None verbalized           Compliance: decreased  Pressure: 23cm  Volume: 250 ml         Detrusor overactivity:no  ??  ??       Leakage: Yes: Pressure 30 cm     Volume: 308 ml w/o awareness      ??   Micturition      Post Test Residual:  375 ml obtained via foley catheter        Comments: Patient was given Bactrim DS PO per UDS protocol and confirmed he is taking Flomax 0.4 mg, trospium 20 mg and Proscar 5 mg daily.  Per [facility]  patient has an averge capacity, decreased compliant bladder with NO sensations and no DO.  He attempted to void with a maximum detrusor pressure reaching 42 cmH2O with no flow.                AUA-IPSS:   No flowsheet data found.      AUA Assessment Score:    ;     AUA Bother Rating:          Past Medical History:        Diagnosis  Date         ?  CHF (congestive heart failure) ([facility])                Past Surgical History:         Procedure  Laterality  Date          ?  HX APPENDECTOMY         ?  HX HERNIA REPAIR              Hiatal hernia repair multiple times          ?  HX UROLOGICAL              Prostate laser enucleation            History reviewed. No pertinent family history.        Current Outpatient Medications          Medication  Sig  Dispense  Refill           ?  carvediloL (COREG) 6.25 mg tablet  take 1 tablet by mouth twice a day         ?  Eliquis 2.5 mg tablet  take 1 tablet by mouth twice a day         ?  finasteride (PROSCAR) 5 mg tablet  Take 5 mg by mouth daily.         ?  furosemide (LASIX) 40 mg tablet  Take 40 mg by mouth.         ?  losartan (COZAAR) 25 mg tablet  Take 25 mg by mouth nightly.         ?  magnesium oxide (MAG-OX) 400 mg tablet  take 1 tablet by mouth once daily         ?  tamsulosin (FLOMAX) 0.4 mg capsule  Take 0.4 mg by mouth.         ?  trospium (SANCTURA) 20 mg tablet  Take 20 mg by mouth daily.         ?  hydrocortisone (CORTAID) 1 % topical cream  Apply  to affected area two (2) times a day. use thin layer  30 g  0           ?  mirabegron ER (Myrbetriq) 50 mg ER tablet  Take 1 Tab by mouth daily.  90 Tab  1           Allergies:   No Known Allergies        Social History          Socioeconomic History         ?  Marital status:  SINGLE              Spouse name:  Not on file         ?  Number of children:  Not on file     ?  Years of education:  Not on file     ?  Highest education level:  Not on file       Occupational History        ?  Not on file       Social Needs         ?  Financial resource strain:  Not on file        ?  Food insecurity              Worry:  Not on file         Inability:  Not on file        ?  Transportation needs              Medical:  Not on file         Non-medical:  Not on file       Tobacco Use         ?  Smoking status:  Former Smoker              Years:  20.00         ?  Smokeless tobacco:  Never Used       Substance and Sexual Activity          ?  Alcohol use:  Not Currently     ?  Drug use:  Not on file     ?  Sexual activity:  Not on file  Lifestyle        ?  Physical activity              Days per week:  Not on file         Minutes per session:  Not on file         ?  Stress:  Not on file       Relationships        ?  Social Engineer, manufacturing systems on phone:  Not on file         Gets together:  Not on file         Attends religious service:  Not on file         Active member of club or organization:  Not on file         Attends meetings of clubs or organizations:  Not on file         Relationship status:  Not on file        ?  Intimate partner violence              Fear of current or ex partner:  Not on file         Emotionally abused:  Not on file         Physically abused:  Not on file         Forced sexual activity:  Not on file        Other Topics  Concern        ?  Not on file       Social History Narrative        ?  Not on file              Review of Systems   Constitutional: Fever: No   Skin: Rash: No   HEENT: Hearing difficulty: No   Eyes: Blurred vision: No   Cardiovascular: Chest pain: No   Respiratory: Shortness of breath: No   Gastrointestinal: Nausea/vomiting: No   Musculoskeletal: Back pain: No   Neurological: Weakness: No   Psychological: Memory loss: No   Comments/additional findings:          PE:      Visit Vitals      Temp  97.3 ??F (36.3 ??C)     Ht  6\' 1"  (1.854 m)     Wt  180 lb (81.6 kg)        BMI  23.75 kg/m??        Constitutional: WDWN, Pleasant and appropriate affect, No acute distress.     CV:  No peripheral swelling noted   Respiratory: No respiratory distress or difficulties   Abdomen:  No abdominal masses or tenderness.  No CVA tenderness. No hernias noted.    GU Male:  *see separate cysto note*    normal to visual inspection, no erythema or irritation, Sphincter with good tone, Rectum with no hemorrhoids,  fissures or masses.  Prostate is 60 grams. Anodular and smooth.    SCROTUM:  No scrotal  rash or lesions noticed.  Normal bilateral testes and epididymis.    PENIS: Cicumcised. Urethral meatus normal in location and size. No urethral discharge.   Skin: No jaundice.     Neuro/Psych:  Alert and oriented x 3. Affect appropriate.    Lymphatic:   No enlarged inguinal lymph nodes.     LE: No edema.  REVIEW OF LABS AND IMAGING:          Results for orders placed or performed in visit on 08/19/19     AMB POC PVR, MEAS,POST-VOID RES,US,NON-IMAGING         Result  Value  Ref Range            PVR  17  cc              No results found for: PSA, Pollie Friar, [id number], [id number]         A copy of today's office visit with all pertinent imaging results and labs were sent to the referring physician,Williams, Inocente Salles, MD         York Pellant, MD 08/22/2019            Medical Documentation is provided with the assistance of Mare Loan, Medical Scribe for York Pellant, MD on  08/22/2019

## 2019-08-19 NOTE — Progress Notes (Signed)
CYSTOSCOPY PROCEDURE NOTE    Patient Name: Donald Blankenship     Preoperative Diagnosis: Urinary Retention    Postoperative Diagnosis: Same  Procedure: Cystoscopy     Surgeon:  Delanna Notice, MD  Assistant: None    This procedure has been fully reviewed with the patient, and written informed consent has been obtained.    History/Imaging:   Donald Blankenship is a 85 y.o. male with h/o urinary retention. See separate office note for complete history.       Results for orders placed or performed in visit on 08/19/19   AMB POC PVR, MEAS,POST-VOID RES,US,NON-IMAGING   Result Value Ref Range    PVR 17 cc       Consent:  All risks, benefits and options were reviewed in detail and the patient agrees to procedure. Risks include but are not limited to bleeding, infection, sepsis, death, dysuria and others.     Universal Procedure Pause:  1. Correct patient confirmed:  yes  2. Allergies confirmed:  yes  3. Patient taking antiplatelet medications:  no  4. Patient taking anticoagulants:  no  5. Correct procedure and side confirmed and consent signed:  yes  6. Antibiotic provided: no        Procedure:  A time-out was performed to properly identify patient, the procedure to be performed.   The patient was placed in the supine position and prepped and draped in the normal fashion. 5 ml of 4% Lidocaine gel was placed in the urethra. Once adequate anesthesia was achieved; the flexible cystoscope was placed into the bladder.    Meatus: Normal   Urethra: Normal  Prostate: large intravesical median lobe, open bladder neck and prostatic urethra   Bladder neck: Open   Trigone: Normal   Trabeculation: None   Diverticuli: no   Lesion: No      Specimen: None  Complications: None  Blood Loss: Minimal to none  Condition/Disposition: Stable, d/c to home.    Assessment & Plan:    1. Urinary Retention / Atonic Bladder               Foley catheter placed??on??02/2019 during admission for A-Fib               UDS 04/21/2019: decreased compliant bladder with NO  sensations and no DO. ??He attempted to void with a maximum detrusor pressure reaching 42 cmH2O with no flow              Cystoscopy today: large intravesical median lobe, open bladder neck and prostatic urethra       See today's separate progress note    __________________________________  Physician: Delanna Notice, MD    Cc;   Tawni Levy, MD    Medical Documentation is provided with the assistance of Dontranika M. Horton, Medical Scribe for York Pellant, MD

## 2019-08-19 NOTE — Progress Notes (Signed)
CYSTOSCOPY PROCEDURE NOTE    Patient Name: Donald Blankenship     Preoperative Diagnosis: Urinary Retention    Postoperative Diagnosis: Same  Procedure: Cystoscopy     Surgeon:  Akin Ojo-Carons, MD  Assistant: None    This procedure has been fully reviewed with the patient, and written informed consent has been obtained.    History/Imaging:   Donald Blankenship is a 85 y.o. male with h/o urinary retention. See separate office note for complete history.       Results for orders placed or performed in visit on 08/19/19   AMB POC PVR, MEAS,POST-VOID RES,US,NON-IMAGING   Result Value Ref Range    PVR 17 cc       Consent:  All risks, benefits and options were reviewed in detail and the patient agrees to procedure. Risks include but are not limited to bleeding, infection, sepsis, death, dysuria and others.     Universal Procedure Pause:  1. Correct patient confirmed:  yes  2. Allergies confirmed:  yes  3. Patient taking antiplatelet medications:  no  4. Patient taking anticoagulants:  no  5. Correct procedure and side confirmed and consent signed:  yes  6. Antibiotic provided: no        Procedure:  A time-out was performed to properly identify patient, the procedure to be performed.   The patient was placed in the supine position and prepped and draped in the normal fashion. 5 ml of 4% Lidocaine gel was placed in the urethra. Once adequate anesthesia was achieved; the flexible cystoscope was placed into the bladder.    Meatus: Normal   Urethra: Normal  Prostate: large intravesical median lobe, open bladder neck and prostatic urethra   Bladder neck: Open   Trigone: Normal   Trabeculation: None   Diverticuli: no   Lesion: No      Specimen: None  Complications: None  Blood Loss: Minimal to none  Condition/Disposition: Stable, d/c to home.    Assessment & Plan:    1. Urinary Retention / Atonic Bladder               Foley catheter placed??on??02/2019 during admission for A-Fib               UDS 04/21/2019: decreased compliant bladder with NO  sensations and no DO. ??He attempted to void with a maximum detrusor pressure reaching 42 cmH2O with no flow              Cystoscopy today: large intravesical median lobe, open bladder neck and prostatic urethra       See today's separate progress note    __________________________________  Physician: Akin Ojo-Carons, MD    Cc;   Williams, Michael T, MD    Medical Documentation is provided with the assistance of Dontranika M. Horton, Medical Scribe for Jatara Huettner Ojo-Carons, MD

## 2019-08-19 NOTE — Progress Notes (Signed)
Donald Blankenship  29-Mar-1935          ICD-10-CM ICD-9-CM    1. Urinary retention  R33.9 788.20 CYSTOURETHROSCOPY      AMB POC PVR, MEAS,POST-VOID RES,US,NON-IMAGING   2. Atonic bladder  N31.2 596.4    3. Encounter for Foley catheter removal  Z46.6 V53.6    4. Benign prostatic hyperplasia with urinary retention  N40.1 600.01     R33.8 788.20        Assessment and Plan:  DRE today: 60 grams, anodular     1. Urinary Retention / Atonic Bladder    Foley catheter placed on 02/2019 during admission for A-Fib    UDS 04/21/2019: decreased compliant bladder with NO sensations and no DO.  He attempted to void with a maximum detrusor pressure reaching 42 cmH2O with no flow   Cystoscopy today: large intravesical median lobe, open bladder neck and prostatic urethra       Foley catheter removed today. Advised pt to RTO if unable to void by 3pm (PVR today: 17cc)   Advised pt to D/C Trospium.     Continue Flomax 0.4 mg, Proscar 5 mg    Will consider Neuromodulation Interstim     2. BPH w LUTS - s/p laser PVP in the past   Cystoscopy today: large intravesical median lobe, open bladder neck and prostatic urethra        RTO in 3 months       All questions during today's visit were answered.       Chief Complaint   Patient presents with   ??? Benign Prostatic Hypertrophy   ??? Urinary Retention         HISTORY OF PRESENT ILLNESS:    Donald Blankenship is a 85 y.o. male who presents today in consultation for urinary retention and BPH screening. Patient has been having foley in place with monthly catheter changes since foley was placed during admission to [facility] in 02/2019 for A-fib. Patient reports a prior hx of retention and he is s/p laser PVP in the past with benefit. Patient had UDS done on 04/21/2019 which showed  averge capacity, decreased compliant bladder with NO sensations and no DO.  He attempted to void with a maximum detrusor pressure reaching 42 cmH2O with no flow. He is currently on Flomax, Proscar, and Trospium to manage his urinary  sx's.      Today, patient is tolerating foley well.   Foley catheter in place today - has maintained foley catheter since 02/2019   Pt denies any bothersome urinary sx's at baseline prior to foley catheter placement  No c/o hesitancy or intermittency at baseline     Asymptomatic for infection today.  Denies any gross hematuria or dysuria.  No N/V/F/C.   No Flank pain or renal colic     PMhx:  A-Fib - s/p Cardioversion, on Eliquis     UDS 04/21/2019  Uroflometry  Comments: 55 ml clear yellow urine obtained    ??  Cystometrogram         Sensation NONE     First sensation None verbalized     Strong desire None verbalized    Capacity None verbalized    Compliance: decreased Pressure: 23cm Volume: 250 ml   Detrusor overactivity:no ?? ??   Leakage: Yes: Pressure 30 cm     Volume: 308 ml w/o awareness    ??  Micturition  Post Test Residual: 375 ml obtained via foley catheter    Comments: Patient was given  Bactrim DS PO per UDS protocol and confirmed he is taking Flomax 0.4 mg, trospium 20 mg and Proscar 5 mg daily.  Per [facility] patient has an averge capacity, decreased compliant bladder with NO sensations and no DO.  He attempted to void with a maximum detrusor pressure reaching 42 cmH2O with no flow.           AUA-IPSS:  No flowsheet data found.    AUA Assessment Score:   ;    AUA Bother Rating:      Past Medical History:   Diagnosis Date   ??? CHF (congestive heart failure) ([facility])        Past Surgical History:   Procedure Laterality Date   ??? HX APPENDECTOMY     ??? HX HERNIA REPAIR      Hiatal hernia repair multiple times   ??? HX UROLOGICAL      Prostate laser enucleation        History reviewed. No pertinent family history.    Current Outpatient Medications   Medication Sig Dispense Refill   ??? carvediloL (COREG) 6.25 mg tablet take 1 tablet by mouth twice a day     ??? Eliquis 2.5 mg tablet take 1 tablet by mouth twice a day     ??? finasteride (PROSCAR) 5 mg tablet Take 5 mg by mouth daily.     ??? furosemide (LASIX) 40 mg tablet Take 40 mg  by mouth.     ??? losartan (COZAAR) 25 mg tablet Take 25 mg by mouth nightly.     ??? magnesium oxide (MAG-OX) 400 mg tablet take 1 tablet by mouth once daily     ??? tamsulosin (FLOMAX) 0.4 mg capsule Take 0.4 mg by mouth.     ??? trospium (SANCTURA) 20 mg tablet Take 20 mg by mouth daily.     ??? hydrocortisone (CORTAID) 1 % topical cream Apply  to affected area two (2) times a day. use thin layer 30 g 0   ??? mirabegron ER (Myrbetriq) 50 mg ER tablet Take 1 Tab by mouth daily. 90 Tab 1       Allergies:  No Known Allergies    Social History     Socioeconomic History   ??? Marital status: SINGLE     Spouse name: Not on file   ??? Number of children: Not on file   ??? Years of education: Not on file   ??? Highest education level: Not on file   Occupational History   ??? Not on file   Social Needs   ??? Financial resource strain: Not on file   ??? Food insecurity     Worry: Not on file     Inability: Not on file   ??? Transportation needs     Medical: Not on file     Non-medical: Not on file   Tobacco Use   ??? Smoking status: Former Smoker     Years: 20.00   ??? Smokeless tobacco: Never Used   Substance and Sexual Activity   ??? Alcohol use: Not Currently   ??? Drug use: Not on file   ??? Sexual activity: Not on file   Lifestyle   ??? Physical activity     Days per week: Not on file     Minutes per session: Not on file   ??? Stress: Not on file   Relationships   ??? Social Wellsite geologist on phone: Not on file     Gets together: Not  on file     Attends religious service: Not on file     Active member of club or organization: Not on file     Attends meetings of clubs or organizations: Not on file     Relationship status: Not on file   ??? Intimate partner violence     Fear of current or ex partner: Not on file     Emotionally abused: Not on file     Physically abused: Not on file     Forced sexual activity: Not on file   Other Topics Concern   ??? Not on file   Social History Narrative   ??? Not on file         Review of Systems  Constitutional: Fever: No   Skin: Rash: No  HEENT: Hearing difficulty: No  Eyes: Blurred vision: No  Cardiovascular: Chest pain: No  Respiratory: Shortness of breath: No  Gastrointestinal: Nausea/vomiting: No  Musculoskeletal: Back pain: No  Neurological: Weakness: No  Psychological: Memory loss: No  Comments/additional findings:       PE:    Visit Vitals  Temp 97.3 ??F (36.3 ??C)   Ht 6\' 1"  (1.854 m)   Wt 180 lb (81.6 kg)   BMI 23.75 kg/m??     Constitutional: WDWN, Pleasant and appropriate affect, No acute distress.    CV:  No peripheral swelling noted  Respiratory: No respiratory distress or difficulties  Abdomen:  No abdominal masses or tenderness.  No CVA tenderness. No hernias noted.   GU Male:  *see separate cysto note*   normal to visual inspection, no erythema or irritation, Sphincter with good tone, Rectum with no hemorrhoids, fissures or masses.  Prostate is 60 grams. Anodular and smooth.   SCROTUM:  No scrotal rash or lesions noticed.  Normal bilateral testes and epididymis.   PENIS: Cicumcised. Urethral meatus normal in location and size. No urethral discharge.  Skin: No jaundice.    Neuro/Psych:  Alert and oriented x 3. Affect appropriate.   Lymphatic:   No enlarged inguinal lymph nodes.    LE: No edema.    REVIEW OF LABS AND IMAGING:      Results for orders placed or performed in visit on 08/19/19   AMB POC PVR, MEAS,POST-VOID RES,US,NON-IMAGING   Result Value Ref Range    PVR 17 cc         No results found for: PSA, 10/19/19, Pollie Friar, [id number]      A copy of today's office visit with all pertinent imaging results and labs were sent to the referring physician,Williams, [id number], MD      Inocente Salles, MD 08/22/2019        Medical Documentation is provided with the assistance of 10/22/2019, Medical Scribe for Mare Loan, MD on 08/22/2019

## 2019-11-18 ENCOUNTER — Encounter: Attending: Urology | Primary: Internal Medicine

## 2019-12-19 NOTE — Telephone Encounter (Signed)
Returned call from patient's daughter Tammie (HIPAA verified) - she requested  Refill for Tamsulosin 0.4 mg sent to the pharmacy- she said that patient has been taking 2 daily of this medication so he ran out but insurance will not allow to refill.  She was advised that I will have to reach out to the nurse regarding this request because last ov notes from Dr. Virl Cagey states otherwise.She also mentioned that patient has stopped taking the finasteride and trospium.  She requested call back at [phone]

## 2019-12-24 MED ORDER — TAMSULOSIN SR 0.4 MG 24 HR CAP
0.4 mg | ORAL_CAPSULE | Freq: Every day | ORAL | 0 refills | Status: DC
Start: 2019-12-24 — End: 2019-12-24

## 2019-12-24 MED ORDER — TAMSULOSIN SR 0.4 MG 24 HR CAP
0.4 mg | ORAL_CAPSULE | Freq: Every day | ORAL | 3 refills | Status: AC
Start: 2019-12-24 — End: ?

## 2019-12-24 NOTE — Telephone Encounter (Signed)
Medication refilled and sent to pharmacy.

## 2023-06-16 DEATH — deceased
